# Patient Record
Sex: Female | Born: 1935 | Race: White | Hispanic: No | Smoking: Former smoker
Health system: Southern US, Community
[De-identification: ages and names within clinical notes are randomized; demographics above are authoritative.]

## PROBLEM LIST (undated history)

## (undated) DIAGNOSIS — E119 Type 2 diabetes mellitus without complications: Secondary | ICD-10-CM

## (undated) DIAGNOSIS — I252 Old myocardial infarction: Secondary | ICD-10-CM

## (undated) DIAGNOSIS — I639 Cerebral infarction, unspecified: Secondary | ICD-10-CM

## (undated) DIAGNOSIS — E785 Hyperlipidemia, unspecified: Secondary | ICD-10-CM

## (undated) DIAGNOSIS — G629 Polyneuropathy, unspecified: Secondary | ICD-10-CM

## (undated) DIAGNOSIS — I2510 Atherosclerotic heart disease of native coronary artery without angina pectoris: Secondary | ICD-10-CM

## (undated) DIAGNOSIS — J449 Chronic obstructive pulmonary disease, unspecified: Secondary | ICD-10-CM

## (undated) DIAGNOSIS — I10 Essential (primary) hypertension: Secondary | ICD-10-CM

## (undated) DIAGNOSIS — I739 Peripheral vascular disease, unspecified: Secondary | ICD-10-CM

## (undated) HISTORY — DX: Type 2 diabetes mellitus without complications: E11.9

## (undated) HISTORY — PX: OTHER SURGICAL HISTORY: SHX169

## (undated) HISTORY — DX: Peripheral vascular disease, unspecified: I73.9

## (undated) HISTORY — PX: TUBAL LIGATION: SHX77

## (undated) HISTORY — DX: Chronic obstructive pulmonary disease, unspecified: J44.9

## (undated) HISTORY — DX: Atherosclerotic heart disease of native coronary artery without angina pectoris: I25.10

## (undated) HISTORY — DX: Polyneuropathy, unspecified: G62.9

## (undated) HISTORY — DX: Old myocardial infarction: I25.2

## (undated) HISTORY — PX: APPENDECTOMY: SHX54

## (undated) HISTORY — DX: Cerebral infarction, unspecified: I63.9

## (undated) HISTORY — DX: Essential (primary) hypertension: I10

## (undated) HISTORY — DX: Hyperlipidemia, unspecified: E78.5

---

## 2003-09-19 ENCOUNTER — Inpatient Hospital Stay (HOSPITAL_COMMUNITY): Admission: EM | Admit: 2003-09-19 | Discharge: 2003-09-21 | Payer: Self-pay

## 2005-08-21 ENCOUNTER — Inpatient Hospital Stay (HOSPITAL_COMMUNITY): Admission: AD | Admit: 2005-08-21 | Discharge: 2005-08-28 | Payer: Self-pay | Admitting: Cardiology

## 2005-08-23 ENCOUNTER — Ambulatory Visit: Payer: Self-pay | Admitting: Cardiology

## 2005-08-23 ENCOUNTER — Encounter: Payer: Self-pay | Admitting: Internal Medicine

## 2005-09-14 ENCOUNTER — Ambulatory Visit: Payer: Self-pay | Admitting: Cardiology

## 2005-10-26 ENCOUNTER — Ambulatory Visit: Payer: Self-pay | Admitting: Cardiology

## 2005-10-26 ENCOUNTER — Inpatient Hospital Stay (HOSPITAL_COMMUNITY): Admission: EM | Admit: 2005-10-26 | Discharge: 2005-11-13 | Payer: Self-pay | Admitting: Cardiology

## 2005-10-27 ENCOUNTER — Ambulatory Visit: Payer: Self-pay | Admitting: Cardiology

## 2005-10-29 DIAGNOSIS — I252 Old myocardial infarction: Secondary | ICD-10-CM

## 2005-10-29 HISTORY — PX: CORONARY ARTERY BYPASS GRAFT: SHX141

## 2005-10-29 HISTORY — DX: Old myocardial infarction: I25.2

## 2005-11-08 IMAGING — CR DG CHEST 2V
2 series · 2 of 2 positions shown · non-contrast
Comparison: none

CLINICAL DATA: Status post CABG, chest pain, shortness of breath.
 CHEST - 2 VIEW:

[view not recorded (1 of 2)]
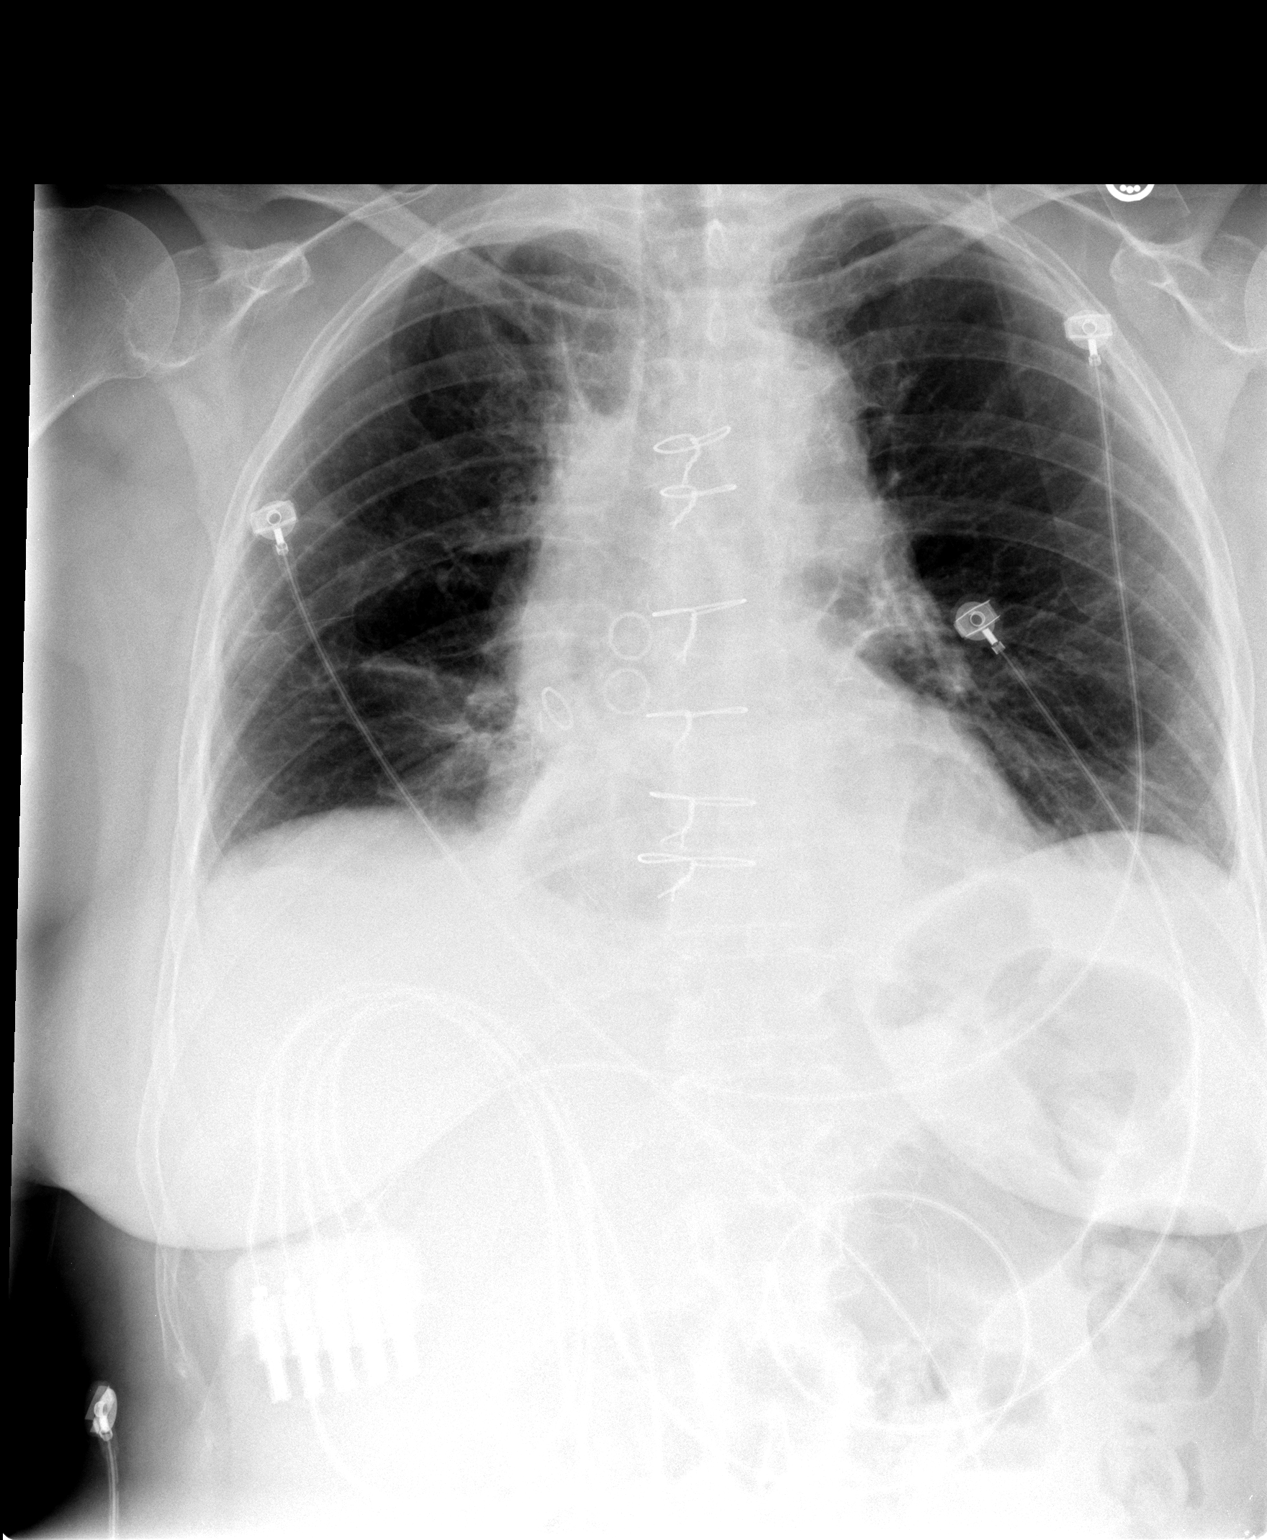

[view not recorded (2 of 2)]
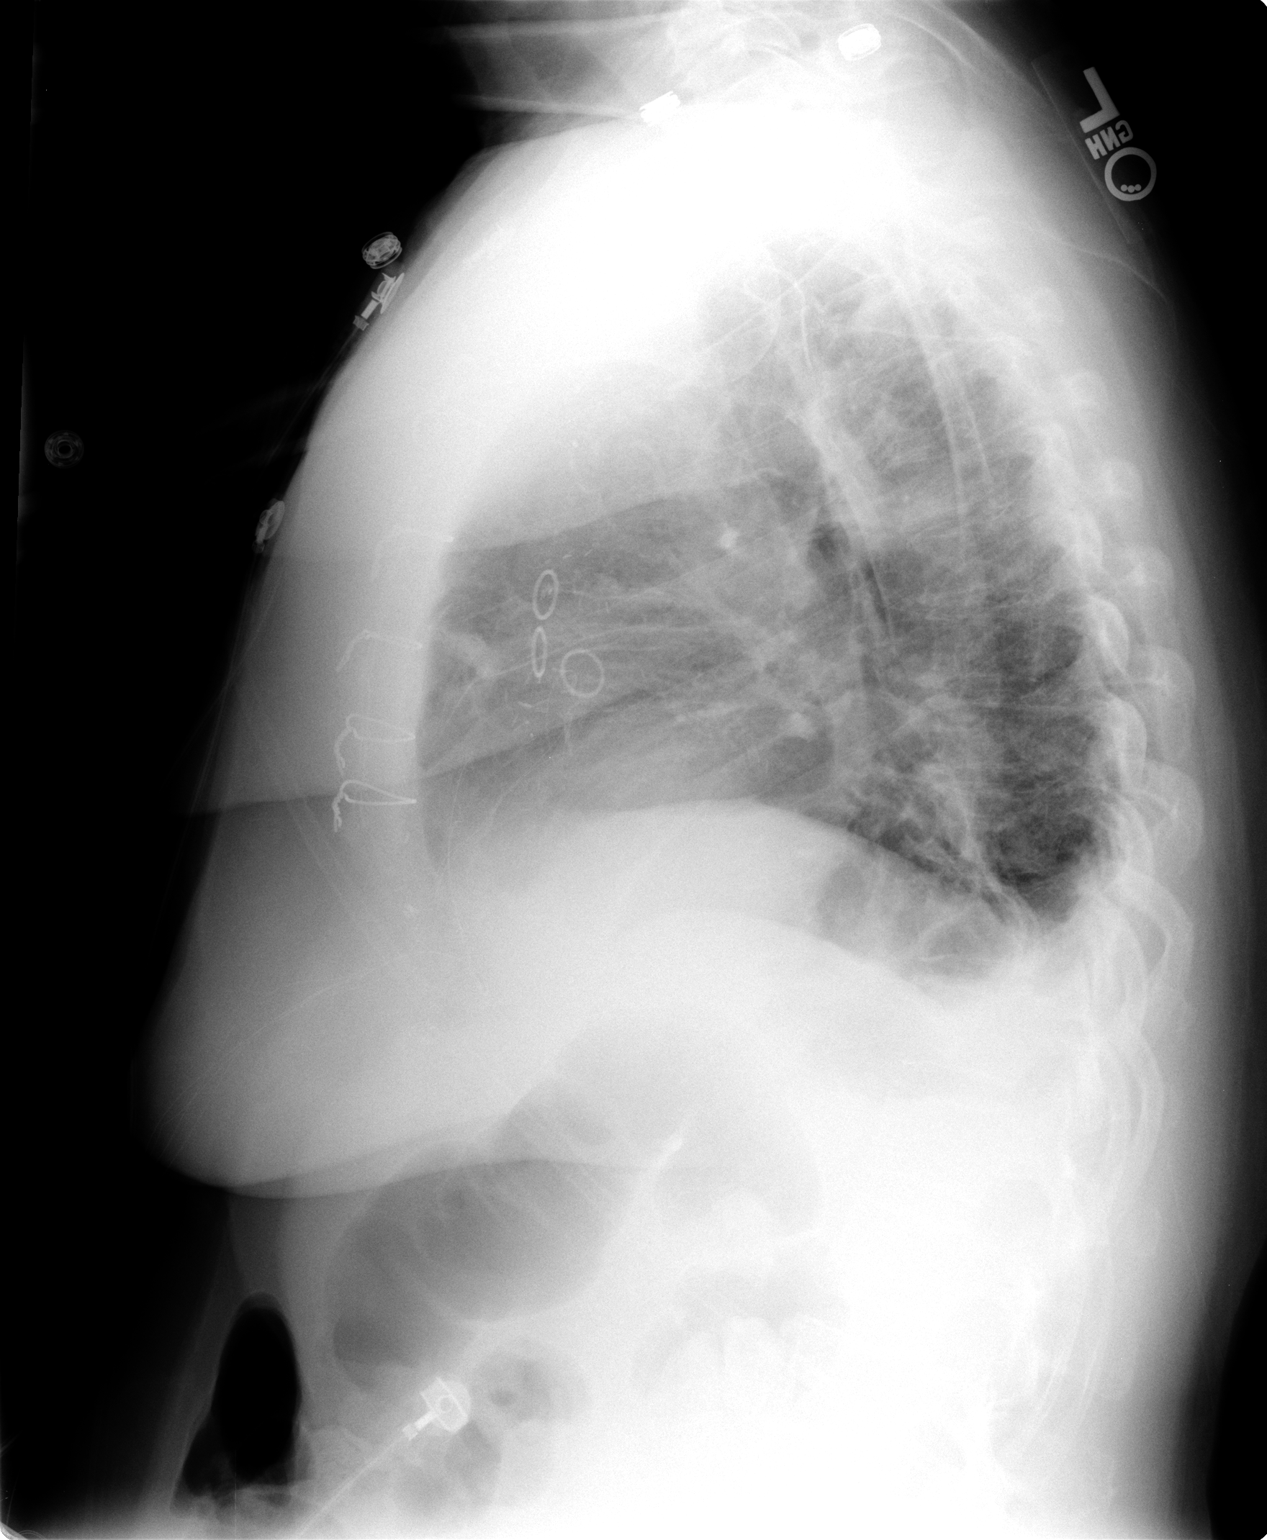

[2 of 2 positions shown; findings below may reference images not displayed]

FINDINGS: Small pleural effusions and mild atelectatic changes at the lung bases.  No pulmonary vascular congestion.  No pneumothorax.  Cardiomediastinal silhouette appears stable.
IMPRESSION: Subsegmental atelectasis at the bases, mainly on the right.  Small pleural effusions.  No pneumothorax.

## 2005-12-06 ENCOUNTER — Encounter
Admission: RE | Admit: 2005-12-06 | Discharge: 2005-12-06 | Payer: Self-pay | Admitting: Thoracic Surgery (Cardiothoracic Vascular Surgery)

## 2005-12-25 ENCOUNTER — Ambulatory Visit: Payer: Self-pay | Admitting: Cardiology

## 2006-03-20 ENCOUNTER — Ambulatory Visit: Payer: Self-pay | Admitting: Cardiology

## 2009-07-26 ENCOUNTER — Ambulatory Visit: Payer: Self-pay | Admitting: Cardiology

## 2010-06-05 ENCOUNTER — Encounter: Admission: RE | Admit: 2010-06-05 | Discharge: 2010-06-05 | Payer: Self-pay | Admitting: Neurosurgery

## 2010-11-07 ENCOUNTER — Ambulatory Visit: Admit: 2010-11-07 | Payer: Self-pay | Admitting: Vascular Surgery

## 2010-11-07 ENCOUNTER — Ambulatory Visit
Admission: RE | Admit: 2010-11-07 | Discharge: 2010-11-07 | Payer: Self-pay | Source: Home / Self Care | Attending: Vascular Surgery | Admitting: Vascular Surgery

## 2011-02-25 DIAGNOSIS — R079 Chest pain, unspecified: Secondary | ICD-10-CM

## 2011-02-26 DIAGNOSIS — I2510 Atherosclerotic heart disease of native coronary artery without angina pectoris: Secondary | ICD-10-CM

## 2011-03-13 NOTE — Consult Note (Signed)
NEW PATIENT CONSULTATION   Kristina Jones, Kristina Jones  DOB:  [birth year]                                       11/07/2010  CHART#:[id number]   The patient presents today for evaluation of lower extremity pain.  She  is a 76-year white female with multiple components to her discomfort.  She reports pain mainly in her hips and knees bilaterally that can occur  at rest or with walking.  She does not have any specific calf  claudication type symptoms.  She does have a history of diabetes.  She  is a poor historian.  She is here with her daughter.  She does have a  history of some degenerative disk disease and underwent injections in  her spine with little improvement.   PAST MEDICAL HISTORY:  Also significant for hypertension, elevated  cholesterol, prior heart attack, congestive heart failure and severe  COPD.  She is not compliant with her medication with any of these  issues..  She does have a prior history of prior elbow surgery and  history of left knee surgery.   SOCIAL HISTORY:  She is single with 5 children.  She is disabled.  She  does not smoke having quit in the past.  She does not drink alcohol on a  regular basis.   REVIEW OF SYSTEMS:  No weight loss or gain.  She weighs 195 pounds.  She  is 5 feet 2 inches tall.  VASCULAR:  Positive for pain in legs with walking and lying flat, does  have a history of mini stroke.  CARDIAC:  Positive shortness breath with lying flat  and with exertion.  GI:  Positive for reflux, difficulty swallowing, diarrhea, and  constipation.  NEUROLOGIC:  Positive for dizziness and headaches.  PULMONARY:  She does have home oxygen use, bronchitis, and wheezing.  HEMATOLOGIC:  Negative.  URINARY:  Positive for frequent urination.  ENT:  Positive for change in eyesight, change in hearing and sore  throat.  MUSCULOSKELETAL:  Positive for arthritis, joint pain, and muscle pain.  PSYCHIATRIC:  Positive for depression, anxiety and attention  deficit  disorder.   PHYSICAL EXAMINATION:  Well developed, well nourished obese white female  in no acute distress.  Blood pressure 132/80, pulse 72, respirations 24,  in no acute distress.  HEENT is normal.  Chest:  She does have some  wheezing bilaterally.  HEART:  Regular rate and rhythm without murmur.  I do not appreciate carotid bruits.  She has 2+ radial pulses, 2+  femoral pulses.  She does have diminished pedal pulses bilaterally.  Abdomen:  Soft, nontender, moderately obese.  Musculoskeletal:  Shows no  major deformity or cyanosis.  Neurologic:  No focal weakness,  paresthesias.  Skin:  Without ulcers.  Extremities:  Left foot is  notable for an ingrown toenail on the left which she has not sought  podiatry help for.  Her daughter states she is quite bad at picking at  these wounds and causing prolonged healing in her opinion.   She did undergo noninvasive laboratory studies in our office today and  this reveals normal ankle arm index on the right and slightly diminished  ankle arm index on the left at 0.79.  I feel that she should have  adequate flow to heal this ingrown toenail issue on her left foot.  I do  not  feel that the majority of her pain symptoms are related to arterial  insufficiency since she has resting symptoms and certainly does not have  level of arterial insufficiency that would suggest rest symptoms.  She  will continue to followup with her medical doctor and will see [place] again  on an as-needed basis. If she does have ongoing difficulty with healing,  I have recommended she see a podiatrist for treatment of this toe  lesion.     Larina Earthly, M.D.  Electronically Signed   TFE/MEDQ  D:  11/07/2010  T:  11/07/2010  Job:  [id number]   cc:   Payton Doughty, M.D.  Lia Hopping

## 2011-03-13 NOTE — Procedures (Signed)
LOWER EXTREMITY ARTERIAL DUPLEX   INDICATION:  Claudication per [facility] standing order.   HISTORY:  Diabetes:  Yes.  Cardiac:  CHF, MI.  Hypertension:  Yes.  Smoking:  Previous.  Previous Surgery:  No.  Other:  Complaint of mostly left lower extremity pain from the foot to  hip.  Patient denies classic intermittent claudication symptoms.   SINGLE LEVEL ARTERIAL EXAM                          RIGHT                LEFT  Brachial:               151                  148  Anterior tibial:        152                  19  Posterior tibial:       139                  120  Peroneal:  Ankle/Brachial Index:   1.01                 0.79   LOWER EXTREMITY ARTERIAL DUPLEX EXAM   DUPLEX:  1. Biphasic Doppler waveforms noted in the left common femoral artery      and proximal profunda femoral arteries.  2. Monophasic Doppler waveforms noted in the left superficial femoral      and popliteal arteries.  3. No significant increase in velocities noted throughout the left      lower extremity arterial system.   IMPRESSION:  1. Patent left lower extremity arterial system with no hemodynamically      significant increase in velocity.  2. Doppler waveforms and ankle brachial indices suggest normal      perfusion of the right lower extremity and mild/moderate decrease      of perfusion in the left lower extremity.   ___________________________________________  Larina Earthly, M.D.   CH/MEDQ  D:  11/07/2010  T:  11/07/2010  Job:  [id number]

## 2011-03-16 NOTE — Discharge Summary (Signed)
Kristina Jones, Kristina [name]:  [id number]   MEDICAL RECORD NO.:  [id number]          PATIENT TYPE:  INP   LOCATION:  2036                         FACILITY:  [facility]   PHYSICIAN:  Salvatore Decent. Hendrickson, M.D.DATE OF BIRTH:  [birth year]   DATE OF ADMISSION:  10/26/2005  DATE OF DISCHARGE:  11/13/2005                                 DISCHARGE SUMMARY   ADMISSION DIAGNOSIS:  Unstable angina over the last one to two weeks, status  post non-ST elevation myocardial infarction with angioplasty and bare metal  stents to the left anterior descending and circumflex.   DISCHARGE DIAGNOSES:  1.  Coronary artery disease, status post coronary artery bypass graft times      four.  2.  Hyperlipidemia.  3.  Hypertension.  4.  History of myocardial infarction.  5.  Diabetes mellitus, type 2, on insulin.  6.  Tobacco abuse.  7.  Chronic obstructive pulmonary disease.  8.  Chronic dyspnea on exertion.  9.  Obstructive sleep apnea.  10. History of noncompliance due to financial issues.   PROCEDURE:  Median sternotomy, extracorporeal circulation, coronary artery  bypass graft times four with LIMA to the LAD, saphenous vein graft to the  first diagonal, saphenous vein graft to the first obtuse marginal, saphenous  vein graft to posterior descending artery, and endoscopic vein harvest to  right leg. Procedures done by Dr. Charlett Lango on November 05, 2005.   HISTORY AND PHYSICAL EXAM:  Ms. Blando is a 77 year old female who has a  history of coronary artery disease, diabetes, hypertension, hyperlipidemia,  tobacco abuse, and COPD, who was seen and treated at [facility] in  October 2006 with non-ST-elevation MI. She has three vessel disease with  chronically totaled right coronary artery, she had a significant proximal  LAD and circumflex disease. The patient was treated with angioplasty and  Plavix. This was due to her history of noncompliance. The patient presented  on  October 26, 2005, with a two week history of neck and jaw discomfort.  The patient had been taking sublingual nitroglycerin for his jaw discomfort.  She complains of some shortness of breath as well as a couple of episodes of  diaphoresis in association with these episodes. The patient has had episodes  at rest. The patient then underwent cardiac catheterization which revealed  again severe three-vessel coronary artery disease with in-stent peri-stent  disease as well as distal left main disease. The cardiac catheterization  showed an ejection fraction of 60%, the left distal main was approximately  60% to 70% stenosed. She did have an episode of pain post catheterization  which resolved with intravenous nitroglycerin.   CVTS was consulted on October 30, 2005. The patient has been on Plavix prior  to admission and was advised to undergo coronary artery bypass grafting. We  waited for the patient to be off for Plavix for six days to allow the  platelet population to repopulate and then we would proceed with a CABG. The  patient did understand the risks of the surgery and agreed to proceed.   HOSPITAL COURSE:  From time of consult on October 30, 2005, to November 05, 2005,  the patient's hospital stay remained uneventful. The patient remained  stable, afebrile, and without chest pain. The patient was started on a  nitroglycerin drip. The patient did have an episode of hyperglycemia  throughout her stay. Her hemoglobin A1c was 13.7.  We monitored on a sliding  scale insulin.  Labs otherwise remained within normal limits. After the  coronary artery bypass graft procedure the patient was extubated  uneventfully on November 05, 2005, with oxygen saturations at 98%. On  postoperative day #1, the patient remained afebrile. She is neurologically  intact. The patient did have some postoperative anemia secondary to blood  loss which was monitored throughout her stay. The patient received one unit  of  packed red blood cells on November 10, 2005. She was also given Niferex  since November 09, 2005.   The patient did have some acute renal failure. She was on lisinopril and  Lasix. The a.m. Lasix was held on November 09, 2005. The patient is having  some leukocytosis. Her white blood cell count has increased to 15.6. There  are no signs of infection and the patient remained afebrile. A urine culture  was done and was positive for UTI. The patient was given Cipro for five  days. The patient did have an increase in her CBGs. She was monitored on a  sliding scale insulin scale. Her Lantus was increased to 21 units b.i.d.  Endocrinology was consulted. The patient was ambulating daily with some  assistance in using her pulmonary toilet.  The patient also has some  external drainage in the mid sternum. Drainage is serous.  She does have  positive erythema in the surrounding incision. The patient is on Cipro.   PHYSICAL EXAMINATION:  On discharge vital signs are stable with temperature  of 97.7, pulse 99, blood pressure 109/68, O2 saturation 93% on room air,  weight 176.  Cardiac revealed a regular rate and rhythm. Respirations  revealed decreased breath sounds bilaterally at the lobes. She does have  some scattered crackles. Abdomen benign. Extremities revealed positive edema  for which she is on Lasix for. Her right leg incisions are clear, dry, and  intact with some mild bruising.  The patient's sternal incision does have  some serous drainage in the mid sternum and erythema surrounding the  incision.   The patient is going to be discharged home in good condition.   MEDICATIONS:  1.  Lopressor 25 mg b.i.d.  2.  Aspirin 325 mg daily.  3.  Zocor 80 mg q.h.s.  4.  Niferex 150 mg b.i.d.  5.  Lopid 600 mg b.i.d.  6.  Amaryl 4 mg daily.  7.  Lantus 25 units b.i.d.  8.  Combivent inhaler as previously used at home. 9.  Oxycodone 5 mg one to two tablets q.4h. p.r.n.   The patient was  instructed to have no driving or lifting of greater than 10  pounds for three weeks. The patient was instructed to ambulate three to four  times daily and progress as tolerated. She is to continue her breathing  exercises. The patient is to follow a low fat, low salt diabetic diet. The  patient was instructed to shower and cleanse her wounds with her mild soap  and water. She is to call the office if any further wound problems arise.  The patient has an appointment with Dr. Dorris Fetch in three weeks. She is  instructed to call  her cardiologist for an appointment in two weeks or she  will have a chest x-ray taken.     Constance Holster, PA    ______________________________  Salvatore Decent Dorris Fetch, M.D.   JMW/MEDQ  D:  11/12/2005  T:  11/12/2005  Job:  [id number]

## 2011-03-16 NOTE — Cardiovascular Report (Signed)
NAME:  Kristina Jones, Kristina [name]:  [id number]   MEDICAL RECORD NO.:  [id number]          PATIENT TYPE:  INP   LOCATION:  2015                         FACILITY:  [facility]   PHYSICIAN:  Charlies Constable, M.D. [facility] DATE OF BIRTH:  [birth year]   DATE OF PROCEDURE:  10/30/2005  DATE OF DISCHARGE:                              CARDIAC CATHETERIZATION   PAST CLINICAL HISTORY:  Kristina Jones is 77 years old and has known coronary  disease and had a previous stenting of the circumflex marginal vessel and on  08/22/05, after suffering an non ST elevation infarction, underwent stenting  of the proximal LAD and stenting of the ostium of the circumflex artery  using bare metal stents.  She developed a current neck pain and was seen up  in [place] and was admitted to the hospital with a diagnosis of unstable  angina.   PROCEDURES:  The procedure was performed via the right femoral artery using  a 6-French preformed coronary catheters.  A front wall arterial puncture was  prominent and opaque contrast was used.  Subclavian injection was performed  to assess the intramammary artery for suitability of bypass grafting.  The  patient tolerated the procedure well and left the laboratory in satisfactory  condition.   RESULTS:  The left main coronary had 70% distal stenosis.   The left anterior descending artery had a 90% ostial stenosis that extended  to the proximal edge of the stent in the proximal LAD.  There was diffuse  40% narrowing within the stent.  A first diagonal branch which appeared to  arise just proximal to the proximal edge of the stent, had a 90% ostial  stenosis.  Distal LAD was free of major obstruction and gave rise to  __________ and a second diagonal branch.   The circumflex artery __________ gave rise to a large marginal branch and a  posterolateral branch.  There was 90%  ostial stenosis within the stent and  80% diffuse stenosis within the stent.  The stent in the marginal  branch had  0% stenosis.   The right coronary artery was completely occluded proximally.  Distal right  coronary artery consistent of posterior descending and small posterolateral  branch and filled by collaterals from the LAD.   The left ventricular and __________ showed hypo to akinesis of inferobasilar  wall.  The overall wall motion was filled with an estimated ejection  fraction of 60%.   Aortic pressure was 122/66 and a mean of 88 and a left ventricular pressure  of 122/9.   CONCLUSION:  Coronary artery status post previous percutaneous coronary  interventions as described above with 70% stenosis in the distal left main,  90% ostial stenosis in the LAD with 40% narrowing within the stent in the  proximal LAD and 90% ostial stenosis of the first large diagonal branch, 90%  ostial and 80% proximal and stent re-stenosis in the circumflex artery with  0% stenosis at the stent site in the marginal branch and total occlusion of  the right coronary artery with inferobasilar wall hypo-akinesis in an  estimated ejection  fraction of 60%.   RECOMMENDATIONS:  The patient developed restenosis in both stents and also  has developed progressive left main disease.  I think there is no other good  option other than bypass surgery.  Will obtain surgical consultation.  I  have discussed with the patient and she is agreeable with obtaining surgical  consultation.           ______________________________  Charlies Constable, M.D. [facility]     BB/MEDQ  D:  10/30/2005  T:  10/30/2005  Job:  [id number]   cc:   Eduardo Osier. Sharyn Lull, M.D.  Fax: [phone]   Jonelle Sidle, M.D. [facility]  [address]  [place]  [place] [address]   Charlies Constable, M.D. [facility]  [address]  [address]  [place]  [place] [address]

## 2011-03-16 NOTE — Cardiovascular Report (Signed)
Kristina Jones, GULAS NO.:  [id number]   MEDICAL RECORD NO.:  [id number]                   PATIENT TYPE:  INP   LOCATION:  4703                                 FACILITY:  [facility]   PHYSICIAN:  Rollene Rotunda, M.D.                DATE OF BIRTH:  21-Jul-1934   DATE OF PROCEDURE:  09/20/2003  DATE OF DISCHARGE:                              CARDIAC CATHETERIZATION   PRIMARY:  [name], MD   PROCEDURE:  Left heart catheter, coronary arteriography.   INDICATIONS:  Evaluate patient with unstable angina.   PROCEDURAL NOTE:  Left heart catheterization was performed via the right  femoral artery.  The artery was cannulated using anterior wall puncture.  A  #6-French arterial sheath was inserted via the modified Seldinger technique.  Preformed Judkins and a pigtail catheter were utilized.  The patient  tolerated the procedure well and left the laboratory in stable condition.   RESULTS:  HEMODYNAMICS:  LV 149/14.  AO 142/93.   CORONARIES:  The left main had luminal irregularities.   The LAD had a proximal 25% stenosis followed by 30% stenosis.  There was mid  diffuse 25% stenosis.  A small first diagonal had diffuse 30-40% stenosis.  A small second diagonal had diffuse 30% stenosis.   The circumflex had long proximal 25% stenosis.  There was 25% stenosis in  the AV groove at the mid obtuse marginal.  The AV groove had mid to distal  diffuse 25-30% stenosis.  There was a large mid obtuse marginal with  proximal 75% stenosis.   The right coronary artery was occluded proximally.  PDA filled via septal  collaterals.   LEFT VENTRICULOGRAM:  The left ventriculogram was obtained in the RAO  projection.  The EF was approximately 60% with mild inferior hypokinesis.   AORTOGRAM:  A distal aortogram was obtained.  The renals were normal.  The  mid and distal aorta had diffuse luminal irregularities.  The right iliac  had moderate nonobstructive disease.  The  left iliac had diffuse luminal  irregularities.   CONCLUSION:  Diffuse moderate obstructive coronary disease in the left  anterior descending and circumflex system.  There does appear to be a high  grade lesion in a moderate to large sized marginal.  She does have an  occluded right coronary.   PLAN:  I will review the films with Carole Binning, M.D. [facility] for possible  angioplasty of the obtuse marginal.  The patient needs very aggressive  secondary risk reduction as her most important therapy.                                               Rollene Rotunda, M.D.    JH/MEDQ  D:  09/20/2003  T:  09/20/2003  Job:  [id number]

## 2011-03-16 NOTE — Discharge Summary (Signed)
Kristina Jones, Kristina [name]:  [id number]   MEDICAL RECORD NO.:  [id number]          PATIENT TYPE:  INP   LOCATION:  4714                         FACILITY:  [facility]   PHYSICIAN:  Salvadore Farber, M.D. LHCDATE OF BIRTH:  1934-07-17   DATE OF ADMISSION:  08/21/2005  DATE OF DISCHARGE:  08/28/2005                                 DISCHARGE SUMMARY   PRIMARY CARDIOLOGIST:  Jonelle Sidle, M.D.   PRIMARY CARE PHYSICIAN:  Dr. Carolynn Comment   PRINCIPAL DIAGNOSES:  1.  Non-ST segment elevation myocardial infarction with percutaneous      transluminal coronary angioplasty and bare metal stents to the left      anterior descending and circumflex.  2.  Residual coronary artery disease in the right coronary artery which is      totaled with left-to-right collateral circulation.  3.  Preserved left ventricular function with an ejection fraction of 70% and      no wall motion abnormality.  4.  Chronic pain issues.  5.  Hyperlipidemia.  6.  Hypertension.  7.  Diabetes.  8.  Chronic dyspnea on exertion with oxygen saturation 94% after ambulation      on room air at discharge.  9.  History of tobacco use.  10. Family history of coronary artery disease.  11. Intolerance to morphine and over-sedation with Dilaudid and Stadol.  12. Enrolled in the [place] trial and research to followup.   PROCEDURES:  1.  Cardiac catheterization.  2.  Coronary arteriogram.  3.  Left ventriculogram.  4.  Bair metal stent to the proximal LAD.  5.  Bair metal stent to the ostium of the circumflex.  6.  Intravascular ultrasound of the circumflex.   HOSPITAL COURSE:  Kristina Jones is a 77 year old female with known coronary  artery disease.  She had a catheterization in 2004, which showed RCA which  was totally occluded, and a critical first marginal lesion which was  stented.  She had some other moderate disease which was treated medically.  She had quit seeing physicians and quit taking all  of her medications due to  expense, but continued to smoke cigarettes.  She developed chest pain on the  day of admission and came to the [facility] Emergency Room where her symptoms  improved with sublingual nitroglycerin.  Her EKG was mildly abnormal and her  initial troponin was borderline.  She was transferred to [facility]  for further evaluation and treatment.   Her enzymes were elevated, indicating a non-ST segment elevation MI.  Her CK-  MB was 12.4, with a troponin-I of 0.42.  Cardiac catheterization showed  three vessel disease with an 80% LAD and a 90% circumflex which were both  treated with Bair metal stents.  The RCA was unchanged and had collateral  circulations.  Her EF was normal.   Kristina Jones has problems with chronic hip pain and also had some arm pain  after a pneumonia shot.  She was treated with Dilaudid and Stadol, but both  caused over sedation requiring Narcan and these medications were  discontinued.   A lipid profile showed a total cholesterol of 212, triglycerides 494, HDL  26, and the LDL was not calculated.  Lipitor 40 mg was added to her  medication regimen, but was changed to Zocor at discharge for cost savings.  She also had Lopid added for the elevated triglycerides.   Kristina Jones had some problems with wheezing and was given nebulizers on a  p.r.n. basis.  She was started on Combivent meter dosed inhaler and is to  continue this at discharge.   Kristina Jones has a history of diabetes, but had not been following a diabetic  diet and had not been taking her diabetes medication.  She was started on  Amaryl and had Lantus added to her medication regimen as well.  She is to  follow up with Dr. Linna Darner on this.  She also had nicotine patches started to  help her discontinue tobacco smoking.   Kristina Jones has significant problems with anxiety.  She required medication  during her hospital stay, but we are not able to provide her with anything  at  discharge.  I requested her followup with Dr. Linna Darner I feel that this is  an ongoing problem and she would benefit from therapy.   Kristina Jones was hypoxic at times with O2 saturation decreasing to 79% at  times after ambulation.  She was continued on the Combivent and she  gradually improved.  By discharge, she was ambulating and her oxygen  saturation was staying in the mid 90s.   Her medications were adjusted for hypertensive control, including a beta  blocker and an ACE inhibitor.  Additionally, she had some short runs of non-  sustained VT, and so the beta blocker was up-titrated to  25 mg of Lopressor q.8h.  At the time, her systolic blood pressure was in  the 110s to 120s, but her heart rate was still in the 70s.  It was not felt  we could increase further because of her blood pressure being borderline,  but perhaps this can be done as an outpatient.   Kristina Jones was seen by cardiac rehab, smoking cessation, and nutrition.  She  was given instruction on a diabetic diet and smoking cessation.   By August 28, 2005, Kristina Jones was ambulating without chest pain or  shortness of breath.  She was evaluated by Dr. Samule Ohm and considered stable  for discharge with outpatient followup arranged.   DISCHARGE INSTRUCTIONS:  1.  Her activity level is to include no driving for four days and no lifting      for two weeks.  2.  She is to stick to a low fat, diabetic diet.  3.  She is to call for problems with the cath site.   DISCHARGE MEDICATIONS:  1.  Coated aspirin 325 mg daily.  2.  Plavix 75 mg daily.  3.  Zocor 80 mg 1/2 tab daily.  4.  Nicotine patch 14 mg daily.  5.  Amaryl 4 mg daily.  6.  Lopid 600 mg b.i.d.  7.  Lisinopril 5 mg daily.  8.  Combivent 2 puffs b.i.d.  9.  Lantus 10 units daily.  10. Nitroglycerin sublingual p.r.n.  11. Metoprolol 25 mg t.i.d.   FOLLOWUP: 1.  She is to follow up with Dr. Diona Browner on September 14, 2005, at 1 p.m.  2.  Dr. Linna Darner as well.       Theodore Demark, P.A. [facility]      Salvadore Farber, M.D.  [facility]  Electronically Signed    RB/MEDQ  D:  08/28/2005  T:  08/28/2005  Job:  [id number]

## 2011-03-16 NOTE — Cardiovascular Report (Signed)
NAMEDINNA, SEVERS NO.:  [id number]   MEDICAL RECORD NO.:  [id number]          PATIENT TYPE:  INP   LOCATION:  2909                         FACILITY:  [facility]   PHYSICIAN:  Salvadore Farber, M.D. LHCDATE OF BIRTH:  1934-03-09   DATE OF PROCEDURE:  08/22/2005  DATE OF DISCHARGE:                              CARDIAC CATHETERIZATION   PROCEDURES PERFORMED:  1.  Left heart catheterization.  2.  Left ventriculography.  3.  Coronary angiography.  4.  Bare metal stenting of the proximal left anterior descending.  5.  Bare metal stenting to the ostium of the circumflex.  6.  Intravascular ultrasound of the circumflex.   CARDIOLOGIST:  Salvadore Farber, M.D.   INDICATIONS:  Ms. Pharr is a 77 year old woman status post prior stenting  of the first obtuse marginal who presents now with non-ST elevation  myocardial infarction.  She has substantial baseline pulmonary disease and  chronic severe left hip pain.  She was referred for diagnostic angiography  and possible percutaneous revascularization.   PROCEDURAL TECHNIQUE:  Informed consent was obtained.  Under 1% lidocaine  local anesthesia a 6 French sheath was placed in the right common femoral  artery using the modified Seldinger technique.  Diagnostic angiography and  ventriculography were performed using JL-4, JR-4 and pigtail catheters.  These images demonstrated an 80% stenosis in the proximal LAD.  The  electrocardiogram suggested this was the culprit lesion.  There was also a  suggestion of a shelf-like lesion in the very proximal circumflex.  This was  difficult to see angiographically despite taking multiple views.  Decision  was therefore made to proceed with stenting of the  LAD and intravascular  ultrasound interrogation of the circumflex to guide further therapy of it.   The patient moaned throughout the procedure and despite substantial doses of  Dilaudid of  pain control of her left hip.   Anticoagulation was initiated  with heparin and double bolus eptifibatide.  Alla Feeling study drug was  administered.  ACT was confirmed to be greater than 200 seconds and  maintained such throughout the case.   A 6 [place] CLS-3.5 guide was advanced over a wire and engaged in the ostium  of the left main.  ProWater wire was advanced to the distal LAD without  difficulty.  The lesion of the LAD was predilated using a 2.5 x 15 mm  Maverick at eight atmospheres.  The lesion was then stented using a 2.75 x  23 mm Vision stent deployed at 16 atmospheres.  The stent was then  postdilated using a 3.0 x 18 mm PowerSail at 18 atmospheres.   Attention was then turned to the circumflex.  The ProWater wire was removed  from the LAD and advanced into the distal portion of the first obtuse  marginal.  I then administered 200 mcg of intracoronary nitroglycerin.  I  advanced an IVUS catheter into the mid circumflex without difficulty.  Automatic pullback was performed.  This demonstrated diffuse and severe  disease of the proximal circumflex with occlusion around the 1 mm catheter  approximately 3 mm  in from the ostium.  Decision was therefore made to  proceed to percutaneous revascularization of this.   The lesion was predilated using the 2.5 x 15 mm Maverick at 10 atmospheres.  I then placed a 2.5 x 18 mm  Mini Vision beginning from the ostium of the  circumflex.  I deployed it at 16 atmospheres.  I then postdilated it using a  2.5 mm Quantum for two successive inflations at 18 atmospheres distally and  20 atmospheres at the ostium.  Final angiography demonstrated no residual  stenosis and TIMI III flow to the distal vasculature in both the LAD and the  circumflex.   The arteriotomy was then closed using a Starclose device.  Complete  hemostasis was obtained.   The patient was then transferred to the holding room in stable condition.   COMPLICATIONS:  None.   FINDINGS:   HEMODYNAMIC DATA:  LV  167/8/14.  EF 70% without regional wall motion  abnormalities.  No aortic stenosis or mitral regurgitation.   Left Main:  The left main has diffuse atherosclerosis without significant  stenosis.   LAD:  The LAD is a large vessel giving rise to two moderate-sized diagonals.  The proximal LAD had an 80% heavily calcified stenosis.  This was stented  to no residual.   Ramus Intermedius:  The ramus intermedius is a relatively small vessel  without significant disease.   Circumflex:  The circumflex is a moderate size vessel giving rise to two  obtuse marginals.  The ostial circumflex had a heavily calcified 90%  stenosis that was stented to no residual.  There was a 60% stenosis proximal  to the stent in the first obtuse marginal.  The stent itself was widely  patent.  There is a 40% stenosis in the AV groove circumflex before the  second marginal.   RCA:  The RCA vessel is occluded proximally.  The distal vessel is supplied  via left-to-right collaterals. This is unchanged from several years ago.   AORTOGRAPHIC DATA:  Abdominal Aortogram:  The abdominal aortogram showed  minimal disease of the abdominal aorta without evidence of aneurysmal  formation.  There is minimal atherosclerotic disease at the infrarenal  abdominal aorta without evidence of aneurysm.  There are single renal  arteries bilaterally and both are normal.   IMPRESSION AND RECOMMENDATIONS:  Successful bare metal stenting of both the  left anterior descending and circumflex.   Bare metal stents were chosen due to her history of medical noncompliance.  She should be continued on a combination of aspirin and Plavix for 30 days.  Consideration should be given to longer term Plavix if compliance can be  achieved.     Salvadore Farber, M.D. [facility]  Electronically Signed    WED/MEDQ  D:  08/22/2005  T:  08/23/2005  Job:  [id number]

## 2011-03-16 NOTE — Discharge Summary (Signed)
Kristina, [name]:  [id number]   MEDICAL RECORD NO.:  [id number]                   PATIENT TYPE:  INP   LOCATION:  6523                                 FACILITY:  [facility]   PHYSICIAN:  Amberg Bing, M.D.               DATE OF BIRTH:  [birth year]   DATE OF ADMISSION:  09/19/2003  DATE OF DISCHARGE:  09/21/2003                                 DISCHARGE SUMMARY   DISCHARGE DIAGNOSES:  1. Coronary artery disease.     A. Status post drug-eluding stent to the obtuse marginal #1, reducing        stenosis from 75 to 0%.     B. Totally occluded right coronary artery, mild residual nonobstructive        disease.  2. Preserved left ventricular function with ejection fraction of 60%.  3. Dyspnea on exertion.  4. Hypertension.  5. Adult-onset diabetes mellitus.  6. Dyslipidemia.  7. Degenerative joint disease.  8. Tobacco abuse.  9. Family history of coronary artery disease.  10.      Urinary tract infection.   PROCEDURE PERFORMED:  1. Cardiac catheterization by Dr. Rollene Rotunda September 20, 2003:  Left     main Luminal irregularities; LAD to proximal 25% to 30%, mid diffuse 25%,     D1 small diffuse 30 to 40%, D2 small diffuse 30%; circumflex long     proximal 25%, 25% at mid obtuse marginal, distal diffuse 25 to 30%. Mid     obtuse marginal proximal 75% stenosis; RCA 100% proximal; PDA fills via     septal collaterals. Distal aortogram with diffuse nonobstructive disease,     patent renals. Left ventriculogram with an EF of 60%.  2. Percutaneous coronary intervention by Dr. Loraine Leriche Pulsipher with drug-     eluding stent placement to the obtuse marginal #1 with reduction of     stenosis of 75 to 0%. Procedure was performed via the left femoral     artery. Wire could not be advanced beyond the sheath in the right femoral     artery.   HOSPITAL COURSE:  Please see admission history and physical dictated by Dr.  Moishe Spice for complete details.  Briefly, this 77 year old female was  transferred [facility] with complaints of chest pressure and  shortness of breath. Her cardiac enzymes were negative. It was felt that  with her risk factors and symptoms she should be further evaluated with  cardiac catheterization. This was set up for September 20, 2003. She  underwent catheterization as noted above by Dr. Antoine Poche with the above  noted findings. She subsequently underwent drug-eluding stent placement to  the obtuse marginal #1 by Dr. Gerri Spore on the same day. She tolerated both  procedures well. She did have some pain in bilateral groins but had no  hematomas post procedure. On the morning of September 21, 2003, she was in  stable condition. She had no further chest pain. She still complained of  dyspnea on exertion. Post procedure CK-MB was negative. Urinalysis did show  positive nitrates, leukocytes, and bacteria. The patient's lipid panel also  returned revealing a total cholesterol of 210, triglycerides 257, HDL 40,  LDL 119. She was placed on Zocor at discharge. Her lisinopril was adjusted  to 20 mg daily for her hypertension. Lopressor 25 mg twice a day was also  added to her blood pressure medicine regimen. She would need Plavix for six  to nine months. She will need a BMET in approximately two weeks to follow up  on her renal function, given the increase in her lisinopril. She will also  need LFTs and lipids checked in about six to eight weeks given the  initiation of Zocor. She prefers to followup in [place], and we will set her up  with Dr. Diona Browner. We may need to consider outpatient pulmonary consultation  as well as PFTs given her continued dyspnea on exertion.   LABORATORY DATA:  Room air ABGs:  PH 7.445, pCO2 34.9, pO2 73.7, bicarbonate  24.8, oxygen saturation 95.1. White count 7,800, hemoglobin 14.5, hematocrit  43.1, platelet count 225,000. D-dimer less than 0.22. Sodium 139, potassium  4, chloride 107, CO2  26, glucose 111, BUN 14, creatinine 0.9; total  bilirubin 0.5, alkaline phosphatase 48, AST 17, ALT 17, total protein 5.8,  albumin 3.3, calcium 8.5. Hemoglobin A1c 6.7. Cardiac enzymes as noted  above. Lipid panel as noted above. Urinalysis as noted above. [facility]  from [facility]:  Cardiomegaly and no acute disease.   DISCHARGE MEDICATIONS:  1. Plavix 75 mg daily for six to nine months.  2. Coated aspirin 325 mg daily.  3. Lopressor 25 mg twice daily.  4. Lisinopril 20 mg daily.  5. Glucotrol 10 mg daily-as taken before.  6. Zocor 20 mg q.h.s.  7. Nitroglycerin p.r.n. chest pain.  8. Cipro 250 mg b.i.d. for three days.   PAIN MANAGEMENT:  Tylenol as needed. Nitroglycerin as needed for chest pain.  She is to call our office or 911 for recurrent chest pain.   ACTIVITY:  No driving, heavy lifting, exertion, or work for one week.   DIET:  Low fat, low sodium, diabetic diet.   WOUND CARE:  The patient is to call office for any groin swelling, bleeding,  or bruising.   FOLLOW UP:  The patient is to followup with our team in [place], [place]. She will be set up to see Dr. Diona Browner in the next two weeks. She  is to see Dr. Clelia Croft as needed. At her followup appointment, we will need to  set her up with fasting lipids and LFTs within the next six to eight weeks.  She will also need a BMET at her followup appointment in two weeks in [place].  A urine culture and sensitivity will be sent off prior to discharge. As  noted above, consideration may be given towards outpatient pulmonary  consultation as well as PFTs given her continued dyspnea.      Kristina Jones, P.A.                        [name], M.D.    SW/MEDQ  D:  09/21/2003  T:  09/21/2003  Job:  [id number]   cc:   [facility]  [address], [address]  [place], [place] [address]   Sherryll Burger, M.D.  [employer]

## 2011-03-16 NOTE — H&P (Signed)
Kristina Jones, [name]:  [id number]   MEDICAL RECORD NO.:  [id number]          PATIENT TYPE:  INP   LOCATION:  2909                         FACILITY:  [facility]   PHYSICIAN:  [place] Bing, M.D. [facility] OF BIRTH:  23-Oct-1934   DATE OF ADMISSION:  08/21/2005  DATE OF DISCHARGE:                                HISTORY & PHYSICAL   REFERRING:  Emergency Department at [facility].   PRIMARY CARE PHYSICIAN:  Dr. Linna Darner.   PRIMARY CARDIOLOGIST:  Dr. Diona Browner.   HISTORY OF PRESENT ILLNESS:  A 77 year old woman with known coronary disease  presents with recurrent chest discomfort. Kristina Jones first was seen at [facility] in 2004 when she presented with unstable angina. At  catheterization, she was found to have an old total occlusion of the right  coronary artery, moderate LAD and circumflex disease and a critical first  marginal lesion, which was stented. The patient subsequently did well with  no symptoms other than class II exertional dyspnea. She was initially  followed in our [place] office, but subsequently stopped seeing physicians and  stopped all of her medications due to expense. She has continued to smoke  cigarettes. On the evening of admission, she developed the sudden onset of  sharp left chest discomfort as well as bilateral jaw discomfort accompanied  by dyspnea. She was felt to be clammy, but was not subjectively diaphoretic.  There was no nausea. She summoned EMS who administered sublingual  nitroglycerin spray with improvement. She received more sublingual  nitroglycerin in the emergency department with resolution of her symptoms.  Initial troponin is borderline by the local assay. EKG is abnormal with  prior inferior myocardial infarction, poor R-wave progression and diffuse  mild ST-segment depression, but we have no previous tracing for comparison.  On route, 2 milligrams of morphine was administered. The patient was  described to become lethargic and have increased problems with respiration.  There is a question of stridor. Increased oxygen was administered with  resolution.   Kristina Jones has multiple contributors to vascular disease including  hypertension, hyperlipidemia and diabetes. She has not required treatment  with insulin in the past. She has had complications of retinopathy and  neuropathy. She has a 40-60 pack year history of cigarette smoking.   Past medical history is otherwise notable for obesity, a prior right arm  fracture, prior left knee surgery, appendectomy and bilateral tubal  ligation.   The patient has no prior known drug allergies, but there is a question of  adverse effect of MORPHINE.   Medications at hospital discharge in 2004 included Glucotrol 10 milligrams  daily, simvastatin 20 milligrams q.d., Lisinopril 20 milligrams q.d.,  metoprolol 25 milligrams b.i.d., aspirin 325 milligrams q.d. and a course of  clopidogrel.   SOCIAL HISTORY:  Lives alone in [place] in a mobile home; works as a Financial risk analyst;  multiple family members live locally. Denies excessive use of alcohol.  Sedentary lifestyle.   FAMILY HISTORY:  Mother died due to renal disease; father suffered a fatal  CVA; brother has had an MI and bypass  surgery as has a sister. Three other  sisters have died from, one from cardiac disease, one from diabetes and one  from cancer.   Review of systems is notable for arthritic discomfort in her knees and hips;  she experiences occasional headaches; she notes decreased hearing acuity.  She has occasional constipation. All other systems reviewed and are  negative.   PHYSICAL EXAMINATION:  GENERAL:  On exam, pleasant woman with flushed skin,  in no acute distress.  VITAL SIGNS:  The heart rate is 70 and regular, O2 saturation is 96%. Blood  pressure 140/90, respirations 16, temperature 99.  HEENT: Anicteric sclerae; normal conjunctiva; lesions over the right eyelid  consistent  with cholesterol deposition.  NECK: No jugular venous distension; normal carotid upstrokes without bruits.  ENDOCRINE: No thyromegaly.  HEMATOPOIETIC: No adenopathy.  SKIN: Dry; no significant lesions.  LUNGS: Minimal basilar rales and rhonchi.  CARDIAC: Normal first and second heart sounds; no murmurs, rubs or gallops;  normal PMI.  ABDOMEN: Soft, nontender; no organomegaly; no masses; normal bowel sounds.  EXTREMITIES: Distal pulses intact; no edema.  NEUROMUSCULAR: Normal cranial nerves; symmetric strength and tone.  MUSCULOSKELETAL: No joint deformities.  PSYCHIATRIC: Alert and oriented; normal affect.   LABORATORY:  Otherwise notable for glucose 325 and a troponin of 0.04 with  upper normal 0.03. CPK values are not available. Renal function is normal.  There is borderline hypokalemia with potassium of 3.7.   IMPRESSION:  Kristina Jones presents with recurrent chest discomfort and known  significant coronary disease. Her symptoms or certainly worrisome for  unstable angina. Initial treatment will include aspirin, a beta blocker,  full anticoagulation with enoxaparin, intravenous nitroglycerin and  clopidogrel. Prior medications will be resumed including an oral  hypoglycemic and a Statin. She will be treated with insulin coverage until  diabetes is controlled. We will obtain diabetic education for her. Serial  cardiac markers and EKGs as well as an echocardiogram will be obtained. She  will likely than require cardiac catheterization. The importance of medical  compliance and discontinuation of tobacco use was discussed with the  patient. Nicotine replacement therapy will be provided.      [place] Bing, M.D. [facility]  Electronically Signed     RR/MEDQ  D:  08/22/2005  T:  08/22/2005  Job:  [id number]

## 2011-03-16 NOTE — H&P (Signed)
[name], [name]:  [id number]   MEDICAL RECORD NO.:  [id number]                   PATIENT TYPE:  INP   LOCATION:  1824                                 FACILITY:  [facility]   PHYSICIAN:  Kristina Jones, M.D.               DATE OF BIRTH:  02/11/1934   DATE OF ADMISSION:  09/19/2003  DATE OF DISCHARGE:                                HISTORY & PHYSICAL   PRIMARY CARE DOCTOR:  Dr. Clelia Jones in [place].   CHIEF COMPLAINT:  Chest pain.   HISTORY OF PRESENT ILLNESS:  Patient is a 77 year old woman transferred from  [facility] with chest pain that began the night prior to  admission at 6:00 p.m. after dinner.  She was watching television and  developed substernal chest pressure with no radiation.  She did have  shortness of breath and diaphoresis but no nausea or vomiting.  This was  described as 8/10.  She attributed it to indigestion, and she also had a  recent cold.  She took three Tylenol and sat down.  It gradually went away  at midnight.  This morning, she felt well.  Around 10:30 in the morning, she  awoke and was walking upstairs at a flea market when she developed similar  symptoms that were described as 6/10 chest pain.  The family took her to the  ER, where she received three baby aspirin, a nitroglycerin patch, and IV  nitroglycerin.  Her pain was relieved to a 0/10.  She has been stable.  On  further history, the patient reports having similar chest pain symptoms for  2-3 weeks.   PAST MEDICAL HISTORY:  1. Non-insulin-dependent diabetes x5 years.  2. Retinopathy.  3. Peripheral neuropathy.  4. Hypertension.  Her blood pressures run 140-190/88-110.  5. Obesity.  6. Status post right arm fracture.  7. Status post left knee surgery.  8. Status post appendectomy.  9. Status post bilateral tubal ligation.  10.      Patient reports no history of MI, CVA, COPD, bleeding, thyroid     problems, or hyperlipidemia.   ALLERGIES:  No  known drug allergies.   MEDICATIONS:  1. Glucotrol, unknown dose, possibly 10 mg once a day.  2. Lisinopril, unknown dose, possibly 10 mg once a day.  3. Darvocet p.r.n.   SOCIAL HISTORY:  Patient lives in [place] alone.  She lives in a mobile home.  She works as a Financial risk analyst.  She has four children and 11 grandchildren.  She  smokes one pack per day.  No alcohol.  She does not have an exercise  program.  She follows a diabetic diet.   FAMILY HISTORY:  Her mother died at 49 of a rare kidney disease.  Her father  died at age 2 from a stroke.  Siblings:  She has one brother who has had an  MI and bypass surgery.  One  sister with an MI and bypass surgery.  Three  sisters who are dead from heart disease, diabetes, and cancer.   REVIEW OF SYSTEMS:  Negative except for:  HEENT:  Headache.  Poor hearing.  CARDIOPULMONARY:  She does have chest pain, shortness of breath, cough, and  wheezing.  MUSCULOSKELETAL:  She has lower extremity arthritis in her hips  and knees.  GI:  Occasional constipation.  All other review of systems are  negative.  She does have a recent left ankle sprain three weeks ago.   PHYSICAL EXAMINATION:  VITAL SIGNS:  Blood pressure 119/72, respirations 23,  pulse 61.  She is satting at 98% on two liters.  GENERAL:  She is a well-developed and well-nourished female in no apparent  distress.  HEENT:  Normocephalic and atraumatic.  PERRLA.  EOMI.  Oropharynx and oral  cavity are clear.  NECK:  No JVD.  No bruits.  Carotid pulses 2+.  No lymphadenopathy.  CARDIOVASCULAR:  Regular rate and rhythm.  No murmurs, rubs or gallops.  No  chest pain on palpation.  LUNGS:  Clear to auscultation bilaterally.  No wheezes, rales, or rhonchi.  SKIN:  She has no lesions.  ABDOMEN:  Soft, nontender, nondistended.  Normal bowel sounds.  No  hepatosplenomegaly.  EXTREMITIES:  No clubbing, cyanosis or edema.  She does have some  varicosities.  No bruits at the femoral sites bilaterally.   MUSCULOSKELETAL:  She does have some left lower extremity edema at the ankle  at the site of her previous ankle sprain.  NEUROLOGIC:  Intact.   Chest x-ray shows cardiomegaly.   EKG:  Normal sinus rhythm.  Rate 77.  QRS interval is 94.  No ST/T wave  changes.   LABS:  White count 6.5, hemoglobin 15, hematocrit 45, platelets 264.  Sodium  138, potassium 3.6, chloride 106, bicarb 27, BUN 19, creatinine 0.9, glucose  198, CK 56, MB 4.4.  Troponin I at 0.01.  PTT 26.5, PT 12.6, INR 1, MCV  93.1.  Calcium 8.5.   ASSESSMENT/PLAN:  Patient is a 77 year old woman with diabetes,  hypertension, obesity, who presents with progressive angina over the last  two weeks with increased symptoms over the last two days.  Symptoms are  fairly atypical.  Her enzymes thus far are negative.  We will admit her for  further evaluation.  Problem #1:  Unstable angina:  We will rule her out for a myocardial  infarction and add a beta blocker.  Plan for catheterization in the morning.  Pre-cath orders have been placed on the chart.  She also is currently on IV  heparin, ACE inhibitor, and IV nitroglycerin.  Problem #2:  Diabetes:  We will continue her on 10 mg of Glucotrol and  follow her blood sugars carefully.  We will request a diabetes consult.  If  her blood sugars are elevated, we will adjust her medication as needed.  Of  course, she will be n.p.o. in the morning for her cardiac catheterization.  Problem #3:  Obesity:  The patient recently lost over 40 pounds on the  Atkins Diet.  She mainly did this for glucose control.  Problem #4:  Hypertension:  The patient seems to have had poorly controlled  blood pressure.  We will follow her blood pressure in-house and adjustment  as needed.  Problem #5:  Risks and procedure for the catheterization have been described  to the patient.  She is aware of what will happen during a catheterization. She will  have further questions addressed by [place] in the  morning.     Kristina Jones, M.D. [facility]                Kristina Jones, M.D.   DWM/MEDQ  D:  09/19/2003  T:  09/19/2003  Job:  [id number]

## 2011-03-16 NOTE — Op Note (Signed)
NAME:  Kristina Jones, Kristina Jones                 ACCOUNT NO.:  [id number]   MEDICAL RECORD NO.:  [id number]          PATIENT TYPE:  INP   LOCATION:  2311                         FACILITY:  [facility]   PHYSICIAN:  Salvatore Decent. Hendrickson, M.D.DATE OF BIRTH:  1934/05/07   DATE OF PROCEDURE:  11/05/2005  DATE OF DISCHARGE:                                 OPERATIVE REPORT   PREOPERATIVE DIAGNOSIS:  Severe three-vessel coronary disease with in-stent  restenosis in previously stented arteries with unstable angina.   POSTOPERATIVE DIAGNOSIS:  Severe three-vessel coronary disease with in-stent  restenosis in previously stented arteries with unstable angina.   PROCEDURE:  Median sternotomy, extracorporeal circulation, coronary artery  bypass grafting x4 (left internal mammary artery to the left anterior  descending, saphenous vein to the first diagonal, saphenous vein graft to  obtuse marginal 1, saphenous vein graft to posterior descending), endoscopic  vein harvest, right leg.   SURGEON:  Salvatore Decent. Dorris Fetch, MD   ASSISTANT:  Sheliah Plane, MD   SECOND ASSISTANT:  Stephanie Acre Dominick, PA   ANESTHESIA:  General.   FINDINGS:  Diagonal a poor-quality target, posterior descending a fair-  quality target, OM-1 and LAD good-quality targets, vein fair, mammary good.   CLINICAL NOTE:  Ms. Kristina Jones is a 77 year old lady with diabetes and multiple  other cardiac risk factor.  She presented with unstable angina back in  October and was treated at time with stents in her LAD and circumflex.  She  now presents with recurrent stable angina with in-stent restenosis.  The  patient had been on Plavix prior to admission.,  She was advised to undergo  coronary bypass grafting.  The indications, risks, benefits and alternatives  were discussed in detail with the patient.  After waiting to the patient to  be off with Plavix for 6 days to allow the platelet population to  repopulate, she was ready to proceed with coronary  bypass grafting.  She did  understand the risk of the surgery and agreed to proceed.   OPERATIVE NOTE:  Ms. Kristina Jones was brought to the preop holding area on November 05, 2005; there, lines were placed by Anesthesia to monitor arterial, central  venous and pulmonary arterial pressure.  Intravenous antibiotics were  administered and she was taken to the operating room, anesthetized and  intubated.  A Foley catheter was placed.  The chest, abdomen and legs were  prepped and draped in the usual fashion.   An incision was made in the medial aspect of the right leg at the level of  the knee.  The greater saphenous vein was harvested endoscopically from the  groin to the mid-calf.  The vein was of fair quality.  Simultaneously, a  median sternotomy was performed and the left internal mammary artery was  harvested using standard technique.  The mammary was of good quality.  Five  thousand units of heparin were administered during the harvest of the  vessels and the remainder of the full heparin dose was given prior to  dividing the distal end of the left mammary artery.   The pericardium was  opened.  The ascending aorta was inspected and palpated;  it was of normal size and caliber; there was no palpable atherosclerotic  disease.  The aorta was cannulated via concentric 2-0 Ethibond pledgeted  pursestring sutures.  A dual-stage venous cannula was placed via a  pursestring suture in the right atrial appendage.  Cardiopulmonary bypass  was instituted and the patient was cooled to 32 degrees Celsius.  The  coronary arteries were inspected and anastomotic sites were chosen.  The  conduits were inspected and cut to length.  A foam pad was placed in the  pericardium to protect the left phrenic nerve.  A temperature probe was  placed in the myocardial septum and a cardioplegic cannula was placed in the  ascending aorta.   The aorta was crossclamped.  The left ventricle was emptied via the aortic  root  vent.  Cardiac arrest then was achieved with a combination of cold  antegrade blood cardioplegia and topical iced saline.  Five hundred  milliliters of cardioplegia were administered.  The myocardial septal  temperature was less than 10 degrees Celsius.  The following distal  anastomoses were performed.   First, a reversed saphenous vein grafts was placed end-to-side to the  posterior descending branch of the right coronary.  The posterior descending  was totally occluded.  The posterior descending was a 1.5-mm vessel, but did  have some diffuse disease and only a 1-mm probe passed distally.  The vein  graft was of fair quality.  It was anastomosed end-to-side with a running 7-  0 Prolene suture.  All anastomoses were probed at their completion; they  were then flushed with iced saline and then cardioplegia was administered  through each vein at its completion.   Next, a reversed saphenous vein grafts was placed end-to-side to OM-1.  This  was a 1.5-mm good-quality target.  The vein was of fair-quality.  The  anastomosis was performed with a running 7-0 Prolene suture.   Next, a reverse saphenous vein graft was placed end-to-side to the first  diagonal.  This was a 1-mm poor-quality target.  The vein was of fair  quality.  The anastomosis was performed with running 7-0 Prolene suture.  A  1-mm probe did pass through the anastomosis at its completion.   Next, the left internal mammary arteries was brought through a window in the  pericardium.  It was then cut to length and the distal end was spatulated.  The LAD was intramyocardial beyond the takeoff of the second diagonal  branch.  It was grafted just beyond the takeoff of the second diagonal,  where it was relatively superficially intramyocardial.  The anastomosis was  performed end-to-side with a running 8-0 Prolene suture.  At the completion of the mammary-to-LAD anastomosis, the bulldog clamp was removed from the  mammary artery.   Rapid septal rewarming was noted.  The bulldog clamp was  replaced and the mammary pedicle was tacked to the epicardial surface of the  heart with 6-0 Prolene sutures.   While the patient was being rewarmed, the proximal anastomoses were  performed to 4.0-mm punch aortotomies with running 6-0 Prolene sutures.  At  the completion of the final proximal anastomosis, the patient was placed in  Trendelenburg position.  De-airing maneuvers were performed.  Lidocaine was  administered and the aortic crossclamp was removed; the total crossclamp  time was 72 minutes.  The patient was initially in heart block, but did not  require defibrillation.   All proximal and  distal anastomoses were inspected for hemostasis.  Epicardial pacing wires were placed on the right ventricle and right atrium,  and DDD pacing was initiated.  A low-dose dopamine infusion was initiated.  When the patient had rewarmed to a core temperature of 37 degrees Celsius,  she weaned from cardiopulmonary bypass on the first attempt without  difficulty.  The total bypass time was 108 minutes.   The initial cardiac output was around 4 L per minute and the patient  remained hemodynamically stable throughout the post-bypass period.  A test  dose protamine was administered and was well-tolerated.  The atrial and  aortic cannulae were removed.  The remainder of the protamine was  administered without incident.  The chest was irrigated with 1 L of warm  normal saline containing 1 g of vancomycin and hemostasis was achieved.  The  pericardium was reapproximated over the ascending aorta and the base of the  heart with interrupted 3-0 silk sutures; it came together easily without  tension.  The sternum was closed with interrupted heavy-gauge stainless  steel wires.  The remainder of the incision was closed in standard fashion.  All sponge, needle and instrument Jones were correct at the end of the  procedure.  The patient was taken from  the operating room to the surgical  intensive care unit in critical but stable condition.           ______________________________  Salvatore Decent Dorris Fetch, M.D.     SCH/MEDQ  D:  11/05/2005  T:  11/06/2005  Job:  [id number]   cc:   Jonelle Sidle, M.D. [facility]  [address]  [place]  [place] [address]

## 2011-03-16 NOTE — Consult Note (Signed)
NAME:  LORENIA, HOSTON                 ACCOUNT NO.:  [id number]   MEDICAL RECORD NO.:  [id number]          PATIENT TYPE:  INP   LOCATION:  2015                         FACILITY:  [facility]   PHYSICIAN:  Salvatore Decent. Dorris Fetch, M.D.DATE OF BIRTH:  April 30, 1934   DATE OF CONSULTATION:  10/30/2005  DATE OF DISCHARGE:                                   CONSULTATION   REASON FOR CONSULTATION:  Three vessel coronary artery disease.   HISTORY OF PRESENT ILLNESS:  Ms. Muhlestein is a 77 year old female who has a  history of coronary artery disease, diabetes, hypertension, hyperlipidemia,  tobacco abuse and COPD, who was seen and treated at [facility] in  October 2006 with a non-ST elevation MI.  She had three of three vessel  disease with a chronically totaled right coronary artery, she had  significant proximal LAD and circumflex disease.  She was treated with  angioplasty with bare metal stents.  This was used due to a history of  noncompliance.  She was treated with Plavix and has been compliant with the  Plavix since it was started.  She now presents with two weeks of neck and  jaw discomfort.  She had been taking sublingual nitroglycerin for this job  discomfort.  She had some shortness of breath as well as a couple of  episodes of diaphoresis in association with these episodes.  She has had  episodes at rest.  She was admitted and underwent cardiac catheterization  which revealed, again, severe three vessel coronary artery disease with in  stent and peri-stent disease as well as distal left main disease.  Her  ejection fraction was 60%, the distal left main was approximately 60-70%  stenosed.  She had an episode of pain post catheterization which resolved  with intravenous nitroglycerin.   PAST MEDICAL HISTORY:  Significant for coronary artery disease, previous MI,  hyperlipidemia, hypertension, type 2 adult onset insulin dependent diabetes,  tobacco abuse, COPD, chronic dyspnea on exertion,  probable obstructive sleep  apnea, previous knee surgery.   MEDICATIONS ON ADMISSION:  Gemfibrozil 600 mg b.i.d., Plavix 75 mg daily,  Simvastatin 80 mg daily, Glimepiride 4 mg daily, Metoprolol 25 mg t.i.d.,  aspirin 325 mg daily, Lisinopril daily, Combivent inhaler p.r.n., Lantus  insulin 10 units daily, and Darvocet p.r.n.   ALLERGIES:  She has no true drug allergies but does have a history of  intolerance to morphine, Dilaudid, and Stadol.   FAMILY HISTORY:  Father had a history of atherosclerotic cardiovascular  disease and stroke.  She has a sister who died from cardiac disease, one  died from diabetes, and one from cancer.  She has a brother and sister who  have had MI and bypass surgery.   SOCIAL HISTORY:  She lives with her daughter in [place].  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  She does not currently work and she does not drink alcohol.  She  smoked tobacco up until October of this year but has quit since that time.   REVIEW OF SYMPTOMS:  Decreased energy, anxiety, decreased hearing, numbness  in her  feet, reflux, arthritis in her knee, all other systems are negative.   PHYSICAL EXAMINATION:  GENERAL:  Ms. Lint is a 77 year old female, she is anxious but in no acute  distress.  VITAL SIGNS:  Blood pressure 126/58, heart rate 80, respirations 16.  NEUROLOGICAL:  Alert and oriented x 3 with no focal motor deficits.  HEENT:  Unremarkable.  NECK:  Without thyromegaly, adenopathy, or bruits.  CARDIAC:  Regular rate and rhythm, normal S1 and S2, no murmurs, gallops,  and rubs.  LUNGS:  Mild expiratory wheezing bilaterally.  ABDOMEN:  Soft, nontender.  EXTREMITIES:  No cyanosis, clubbing, and edema.  I am not able to palpate  dorsalis pedes or posterior tibial pulses bilaterally.  She does have  varicosities.  SKIN:  Warm and dry.   LABORATORY DATA:  EKG showed lateral ischemia.  Her sodium is 136, potassium  4.5, glucose 287 on admission, BUN and creatinine 24 and 0.9, white  count  5.3, hematocrit 39, platelets 253.  Protime 14.1 with an INR 1.1, PTT 40.  LFTs within normal limits.  Her CK was 41 on admission with an MB 1.9, her  troponin was 0.05.  Cholesterol 164, HDL 43, triglycerides 215.   IMPRESSION:  Ms. Kalan is a 77 year old female who presented with a non-Q  wave MI in October.  She was treated with bare metal stenting to her LAD and  circumflex and now comes back with recurrent angina and significant in stent  and peri-stent disease.  She has a chronically totaled right coronary with  left to right collaterals but a poor appearing vessel in that area.  Coronary artery bypass grafting is indicated for survival benefits as well  as relief of symptoms.  Unfortunately, this is more complicated in this  situation by her Plavix use, proceeding with coronary artery bypass grafting  immediately would be complicated by a significant increase in the risks of  bleeding, the need for blood transfusions, potentially massive and related  morbidities with those problems.  The current standard of care with Plavix  is to wait 5-7 days prior to surgery if the patient can be managed medically  in that interval.  It is unclear with this lady whether she will be able to  be managed medically during that time.  If she continues to have problems  despite maximal medical therapy, we may be forced to proceed with surgery  and accept the significantly increased risk.  I had a long discussion with  the patient and her daughter and discussed with them the nature of her  disease, the indications, risks, benefits, and alternative treatments.  They  understand that the risks include but are not limited to death, stroke, MI,  DVT, PE, bleeding, possible need for transfusions, infection, as well as  other organ dysfunction including respiratory, renal, or GI complications. She understands and accepts these risks and agrees to proceed with the  surgery.            ______________________________  Salvatore Decent Dorris Fetch, M.D.     SCH/MEDQ  D:  10/30/2005  T:  10/30/2005  Job:  [id number]   cc:   Charlies Constable, M.D. [facility]  [address]  [place]  [place] [address]   Jonelle Sidle, M.D. [facility]  [address]  [place]  [place] [address]   [name], MD  Fax: [phone]

## 2012-02-18 DIAGNOSIS — R079 Chest pain, unspecified: Secondary | ICD-10-CM

## 2012-02-19 DIAGNOSIS — I2510 Atherosclerotic heart disease of native coronary artery without angina pectoris: Secondary | ICD-10-CM

## 2012-02-25 ENCOUNTER — Other Ambulatory Visit: Payer: Self-pay | Admitting: Cardiology

## 2012-02-25 DIAGNOSIS — I2510 Atherosclerotic heart disease of native coronary artery without angina pectoris: Secondary | ICD-10-CM

## 2012-02-26 DIAGNOSIS — R072 Precordial pain: Secondary | ICD-10-CM

## 2012-02-27 ENCOUNTER — Telehealth: Payer: Self-pay | Admitting: *Deleted

## 2012-02-27 NOTE — Telephone Encounter (Signed)
Pt notified of results and verbalized understanding. Confirmed upcoming appt w/Dr. Diona Browner.

## 2012-02-27 NOTE — Telephone Encounter (Signed)
Message copied by Arlyss Gandy on Wed Feb 27, 2012  2:42 PM ------      Message from: MCDOWELL, Illene Bolus      Created: Wed Feb 27, 2012 10:15 AM       Reviewed report. Evidence of either attenuation versus scar in the apical anteroseptal and inferolateral distribution, LVEF normal however at 66%. No definite ischemia. Patient has known CAD based on records. Can likely be managed medically at this point based on test results. She should have a followup visit scheduled from her recent consultation at [facility].

## 2012-03-19 ENCOUNTER — Ambulatory Visit (INDEPENDENT_AMBULATORY_CARE_PROVIDER_SITE_OTHER): Payer: Medicare Other | Admitting: Cardiology

## 2012-03-19 ENCOUNTER — Encounter: Payer: Self-pay | Admitting: *Deleted

## 2012-03-19 VITALS — BP 138/72 | HR 85 | Resp 16 | Ht 61.0 in | Wt 190.0 lb

## 2012-03-19 DIAGNOSIS — I2510 Atherosclerotic heart disease of native coronary artery without angina pectoris: Secondary | ICD-10-CM

## 2012-03-19 DIAGNOSIS — E782 Mixed hyperlipidemia: Secondary | ICD-10-CM

## 2012-03-19 DIAGNOSIS — I10 Essential (primary) hypertension: Secondary | ICD-10-CM

## 2012-03-19 NOTE — Patient Instructions (Signed)

## 2012-03-19 NOTE — Assessment & Plan Note (Signed)
Blood pressure control is reasonable today. No changes made. 

## 2012-03-19 NOTE — Assessment & Plan Note (Signed)
History reviewed above. Recent Myoview shows no frank ischemia, plan to continue medical therapy and observation for now. Six-month followup arranged.

## 2012-03-19 NOTE — Assessment & Plan Note (Signed)
Followed by Dr. Olena Leatherwood. She is on multi-mode of therapy including omega-3, gemfibrozil, simvastatin. Can review followup lab work when she has this checked at her routine visits. Would be on the lookout for possible side effects.

## 2012-03-19 NOTE — Progress Notes (Signed)
Clinical Summary Ms. Tonner is a 78 y.o.female presenting to the office for post hospital followup visit. She was seen at [facility] back in April by Dr. Myrtis Ser in the setting of right arm pain that was felt to be musculoskeletal rather than ischemic. She ruled out for myocardial infarction. Echocardiogram demonstrated LVEF of 55-60% with mild LVH, diastolic dysfunction, inferolateral hypokinesis, mild mitral regurgitation.  She was subsequent referred for a followup Myoview which demonstrated evidence of attenuation versus scar in the apical anteroseptal and inferolateral distribution, LVEF of 66%. There was no definite ischemia.  She is here with her daughter today. Reports no angina. Remains functionally limited, uses a wheelchair most of the time, also oxygen as needed.  We reviewed her cardiac regimen and recent Myoview results. Plan to continue observation for now.   Allergies  Allergen Reactions  . Butorphanol   . Levaquin (Levofloxacin In D5w)   . Morphine And Related   . Nitrofuran Derivatives   . Quinolones     Current Outpatient Prescriptions  Medication Sig Dispense Refill  . ALPRAZolam (XANAX) 0.5 MG tablet Take 0.5 mg by mouth 2 (two) times daily.      Marland Kitchen amitriptyline (ELAVIL) 50 MG tablet Take 50 mg by mouth at bedtime.      Marland Kitchen aspirin-acetaminophen-caffeine (EXCEDRIN MIGRAINE) 250-250-65 MG per tablet Take 1 tablet by mouth as needed.      . Aspirin-Dipyridamole (AGGRENOX PO) Take by mouth 2 (two) times daily.      . citalopram (CELEXA) 20 MG tablet Take 20 mg by mouth daily.      . furosemide (LASIX) 40 MG tablet Take 40 mg by mouth daily.      Marland Kitchen gemfibrozil (LOPID) 600 MG tablet Take 600 mg by mouth 2 (two) times daily before a meal.      . glimepiride (AMARYL) 4 MG tablet Take 4 mg by mouth daily before breakfast.      . GuaiFENesin (MUCINEX PO) Take by mouth 2 (two) times daily.      . insulin glargine (LANTUS) 100 UNIT/ML injection Inject into the skin as directed.       . Insulin Lispro Prot & Lispro (HUMALOG MIX 75/25 ) Inject into the skin as directed.      Marland Kitchen l-methylfolate-B6-B12 (METANX) 3-35-2 MG TABS Take 1 tablet by mouth daily.      Marland Kitchen lidocaine (LIDODERM) 5 % Place 1 patch onto the skin as needed. Remove & Discard patch within 12 hours or as directed by MD      . metoprolol tartrate (LOPRESSOR) 25 MG tablet Take 25 mg by mouth 3 (three) times daily.      . Omega-3 Fatty Acids (FISH OIL PO) Take by mouth 3 (three) times daily.      Marland Kitchen oxyCODONE-acetaminophen (PERCOCET) 7.5-325 MG per tablet Take 1 tablet by mouth every 8 (eight) hours as needed.      . simvastatin (ZOCOR) 80 MG tablet Take 80 mg by mouth at bedtime.        Past Medical History  Diagnosis Date  . COPD (chronic obstructive pulmonary disease)     Supplemental oxygen  . CVA (cerebrovascular accident)   . Essential hypertension, benign   . Type 2 diabetes mellitus   . Hyperlipidemia   . Coronary atherosclerosis of native coronary artery     Multivessel, BMS to LAD and circumflex 2006  . History of non-ST elevation myocardial infarction (NSTEMI) 2007  . Peripheral arterial disease   . Peripheral neuropathy  Past Surgical History  Procedure Date  . Coronary artery bypass graft 2007    Dr. Dorris Fetch - LIMA to LAD, SVG to diagonal, SVG to OM, SVG to PDA  . Tubal ligation   . Appendectomy   . Right elbow surgery     Family History  Problem Relation Age of Onset  . Coronary artery disease      Social History Ms. Chirino reports that she has quit smoking. Her smoking use included Cigarettes. She has never used smokeless tobacco. Ms. Martes reports that she does not drink alcohol.  Review of Systems No palpitations. No orthopnea. Does have intermittent lower extremity edema, worse when she has increased sodium in her diet. Also states that she is relatively unsteady on her feet, has had falls in the past. Otherwise negative.  Physical Examination Filed Vitals:    03/19/12 0850  BP: 138/72  Pulse: 85  Resp: 16   Chronically ill appearing woman, no acute distress. HEENT: Conjunctiva and lids normal, oropharynx with poor dentition. Neck: Supple, no elevated JVP or carotid bruits, no thyromegaly. Lungs: Clear to auscultation, nonlabored breathing at rest. Cardiac: Regular rate and rhythm, no S3, soft systolic murmur, no pericardial rub. Abdomen: Soft, nontender, bowel sounds present, no guarding or rebound. Extremities: Trace edema, venous stasis, distal pulses 1+. Skin: Warm and dry. Musculoskeletal: No kyphosis. Neuropsychiatric: Alert and oriented x3, affect grossly appropriate.   Problem List and Plan   Coronary atherosclerosis of native coronary artery History reviewed above. Recent Myoview shows no frank ischemia, plan to continue medical therapy and observation for now. Six-month followup arranged.  Essential hypertension, benign Blood pressure control is reasonable today. No changes made.  Mixed hyperlipidemia Followed by Dr. Olena Leatherwood. She is on multi-mode of therapy including omega-3, gemfibrozil, simvastatin. Can review followup lab work when she has this checked at her routine visits. Would be on the lookout for possible side effects.     Jonelle Sidle, M.D., F.A.C.C.

## 2015-11-25 DIAGNOSIS — I69351 Hemiplegia and hemiparesis following cerebral infarction affecting right dominant side: Secondary | ICD-10-CM | POA: Diagnosis not present

## 2015-11-25 DIAGNOSIS — E1121 Type 2 diabetes mellitus with diabetic nephropathy: Secondary | ICD-10-CM | POA: Diagnosis not present

## 2015-11-25 DIAGNOSIS — J441 Chronic obstructive pulmonary disease with (acute) exacerbation: Secondary | ICD-10-CM | POA: Diagnosis not present

## 2016-02-27 DIAGNOSIS — E1121 Type 2 diabetes mellitus with diabetic nephropathy: Secondary | ICD-10-CM | POA: Diagnosis not present

## 2016-02-27 DIAGNOSIS — E1122 Type 2 diabetes mellitus with diabetic chronic kidney disease: Secondary | ICD-10-CM | POA: Diagnosis not present

## 2016-02-27 DIAGNOSIS — Z0000 Encounter for general adult medical examination without abnormal findings: Secondary | ICD-10-CM | POA: Diagnosis not present

## 2016-02-27 DIAGNOSIS — Z79899 Other long term (current) drug therapy: Secondary | ICD-10-CM | POA: Diagnosis not present

## 2016-03-06 DIAGNOSIS — Z1231 Encounter for screening mammogram for malignant neoplasm of breast: Secondary | ICD-10-CM | POA: Diagnosis not present

## 2016-03-21 DIAGNOSIS — M810 Age-related osteoporosis without current pathological fracture: Secondary | ICD-10-CM | POA: Diagnosis not present

## 2016-03-21 DIAGNOSIS — Z7984 Long term (current) use of oral hypoglycemic drugs: Secondary | ICD-10-CM | POA: Diagnosis not present

## 2016-03-21 DIAGNOSIS — M069 Rheumatoid arthritis, unspecified: Secondary | ICD-10-CM | POA: Diagnosis not present

## 2016-03-21 DIAGNOSIS — Z79899 Other long term (current) drug therapy: Secondary | ICD-10-CM | POA: Diagnosis not present

## 2016-03-21 DIAGNOSIS — Z780 Asymptomatic menopausal state: Secondary | ICD-10-CM | POA: Diagnosis not present

## 2016-03-21 DIAGNOSIS — E119 Type 2 diabetes mellitus without complications: Secondary | ICD-10-CM | POA: Diagnosis not present

## 2016-03-21 DIAGNOSIS — Z7982 Long term (current) use of aspirin: Secondary | ICD-10-CM | POA: Diagnosis not present

## 2016-03-21 DIAGNOSIS — M1990 Unspecified osteoarthritis, unspecified site: Secondary | ICD-10-CM | POA: Diagnosis not present

## 2016-03-21 DIAGNOSIS — I10 Essential (primary) hypertension: Secondary | ICD-10-CM | POA: Diagnosis not present

## 2016-03-21 DIAGNOSIS — C539 Malignant neoplasm of cervix uteri, unspecified: Secondary | ICD-10-CM | POA: Diagnosis not present

## 2016-03-21 DIAGNOSIS — J449 Chronic obstructive pulmonary disease, unspecified: Secondary | ICD-10-CM | POA: Diagnosis not present

## 2016-03-21 DIAGNOSIS — Z794 Long term (current) use of insulin: Secondary | ICD-10-CM | POA: Diagnosis not present

## 2016-04-03 DIAGNOSIS — M810 Age-related osteoporosis without current pathological fracture: Secondary | ICD-10-CM | POA: Diagnosis not present

## 2016-05-29 DIAGNOSIS — E1122 Type 2 diabetes mellitus with diabetic chronic kidney disease: Secondary | ICD-10-CM | POA: Diagnosis not present

## 2016-05-29 DIAGNOSIS — Z0000 Encounter for general adult medical examination without abnormal findings: Secondary | ICD-10-CM | POA: Diagnosis not present

## 2016-05-29 DIAGNOSIS — L308 Other specified dermatitis: Secondary | ICD-10-CM | POA: Diagnosis not present

## 2016-05-29 DIAGNOSIS — J440 Chronic obstructive pulmonary disease with acute lower respiratory infection: Secondary | ICD-10-CM | POA: Diagnosis not present

## 2016-05-29 DIAGNOSIS — Z1389 Encounter for screening for other disorder: Secondary | ICD-10-CM | POA: Diagnosis not present

## 2016-05-29 DIAGNOSIS — C3401 Malignant neoplasm of right main bronchus: Secondary | ICD-10-CM | POA: Diagnosis not present

## 2016-08-06 DIAGNOSIS — J441 Chronic obstructive pulmonary disease with (acute) exacerbation: Secondary | ICD-10-CM | POA: Diagnosis not present

## 2016-08-06 DIAGNOSIS — E1122 Type 2 diabetes mellitus with diabetic chronic kidney disease: Secondary | ICD-10-CM | POA: Diagnosis not present

## 2016-08-20 DIAGNOSIS — I63441 Cerebral infarction due to embolism of right cerebellar artery: Secondary | ICD-10-CM | POA: Diagnosis not present

## 2016-08-20 DIAGNOSIS — R531 Weakness: Secondary | ICD-10-CM | POA: Diagnosis not present

## 2016-08-20 DIAGNOSIS — N390 Urinary tract infection, site not specified: Secondary | ICD-10-CM | POA: Diagnosis not present

## 2016-08-20 DIAGNOSIS — I6349 Cerebral infarction due to embolism of other cerebral artery: Secondary | ICD-10-CM | POA: Diagnosis not present

## 2016-08-20 DIAGNOSIS — N184 Chronic kidney disease, stage 4 (severe): Secondary | ICD-10-CM | POA: Diagnosis not present

## 2016-08-21 DIAGNOSIS — I129 Hypertensive chronic kidney disease with stage 1 through stage 4 chronic kidney disease, or unspecified chronic kidney disease: Secondary | ICD-10-CM | POA: Diagnosis not present

## 2016-08-21 DIAGNOSIS — R531 Weakness: Secondary | ICD-10-CM | POA: Diagnosis not present

## 2016-08-21 DIAGNOSIS — I63441 Cerebral infarction due to embolism of right cerebellar artery: Secondary | ICD-10-CM | POA: Diagnosis not present

## 2016-08-21 DIAGNOSIS — I6349 Cerebral infarction due to embolism of other cerebral artery: Secondary | ICD-10-CM | POA: Diagnosis not present

## 2016-08-21 DIAGNOSIS — K449 Diaphragmatic hernia without obstruction or gangrene: Secondary | ICD-10-CM | POA: Diagnosis not present

## 2016-08-21 DIAGNOSIS — D631 Anemia in chronic kidney disease: Secondary | ICD-10-CM | POA: Diagnosis not present

## 2016-08-21 DIAGNOSIS — R296 Repeated falls: Secondary | ICD-10-CM | POA: Diagnosis not present

## 2016-08-21 DIAGNOSIS — Z79899 Other long term (current) drug therapy: Secondary | ICD-10-CM | POA: Diagnosis not present

## 2016-08-21 DIAGNOSIS — Z955 Presence of coronary angioplasty implant and graft: Secondary | ICD-10-CM | POA: Diagnosis not present

## 2016-08-21 DIAGNOSIS — Z881 Allergy status to other antibiotic agents status: Secondary | ICD-10-CM | POA: Diagnosis not present

## 2016-08-21 DIAGNOSIS — A0472 Enterocolitis due to Clostridium difficile, not specified as recurrent: Secondary | ICD-10-CM | POA: Diagnosis not present

## 2016-08-21 DIAGNOSIS — Z794 Long term (current) use of insulin: Secondary | ICD-10-CM | POA: Diagnosis not present

## 2016-08-21 DIAGNOSIS — E1169 Type 2 diabetes mellitus with other specified complication: Secondary | ICD-10-CM | POA: Diagnosis not present

## 2016-08-21 DIAGNOSIS — Z8673 Personal history of transient ischemic attack (TIA), and cerebral infarction without residual deficits: Secondary | ICD-10-CM | POA: Diagnosis not present

## 2016-08-21 DIAGNOSIS — E1122 Type 2 diabetes mellitus with diabetic chronic kidney disease: Secondary | ICD-10-CM | POA: Diagnosis not present

## 2016-08-21 DIAGNOSIS — I69928 Other speech and language deficits following unspecified cerebrovascular disease: Secondary | ICD-10-CM | POA: Diagnosis not present

## 2016-08-21 DIAGNOSIS — I2510 Atherosclerotic heart disease of native coronary artery without angina pectoris: Secondary | ICD-10-CM | POA: Diagnosis not present

## 2016-08-21 DIAGNOSIS — J449 Chronic obstructive pulmonary disease, unspecified: Secondary | ICD-10-CM | POA: Diagnosis not present

## 2016-08-21 DIAGNOSIS — Z780 Asymptomatic menopausal state: Secondary | ICD-10-CM | POA: Diagnosis not present

## 2016-08-21 DIAGNOSIS — E785 Hyperlipidemia, unspecified: Secondary | ICD-10-CM | POA: Diagnosis not present

## 2016-08-21 DIAGNOSIS — I10 Essential (primary) hypertension: Secondary | ICD-10-CM | POA: Diagnosis not present

## 2016-08-21 DIAGNOSIS — G8194 Hemiplegia, unspecified affecting left nondominant side: Secondary | ICD-10-CM | POA: Diagnosis not present

## 2016-08-21 DIAGNOSIS — N184 Chronic kidney disease, stage 4 (severe): Secondary | ICD-10-CM | POA: Diagnosis not present

## 2016-08-21 DIAGNOSIS — N390 Urinary tract infection, site not specified: Secondary | ICD-10-CM | POA: Diagnosis not present

## 2016-08-21 DIAGNOSIS — Z8249 Family history of ischemic heart disease and other diseases of the circulatory system: Secondary | ICD-10-CM | POA: Diagnosis not present

## 2016-08-21 DIAGNOSIS — Z79891 Long term (current) use of opiate analgesic: Secondary | ICD-10-CM | POA: Diagnosis not present

## 2016-08-21 DIAGNOSIS — Z87891 Personal history of nicotine dependence: Secondary | ICD-10-CM | POA: Diagnosis not present

## 2016-08-21 DIAGNOSIS — I63233 Cerebral infarction due to unspecified occlusion or stenosis of bilateral carotid arteries: Secondary | ICD-10-CM | POA: Diagnosis not present

## 2016-08-21 DIAGNOSIS — I69922 Dysarthria following unspecified cerebrovascular disease: Secondary | ICD-10-CM | POA: Diagnosis not present

## 2016-08-21 DIAGNOSIS — G8191 Hemiplegia, unspecified affecting right dominant side: Secondary | ICD-10-CM | POA: Diagnosis not present

## 2016-08-21 DIAGNOSIS — Z886 Allergy status to analgesic agent status: Secondary | ICD-10-CM | POA: Diagnosis not present

## 2016-08-21 DIAGNOSIS — M6281 Muscle weakness (generalized): Secondary | ICD-10-CM | POA: Diagnosis not present

## 2016-08-21 DIAGNOSIS — R2689 Other abnormalities of gait and mobility: Secondary | ICD-10-CM | POA: Diagnosis not present

## 2016-08-21 DIAGNOSIS — Z888 Allergy status to other drugs, medicaments and biological substances status: Secondary | ICD-10-CM | POA: Diagnosis not present

## 2016-08-24 DIAGNOSIS — I10 Essential (primary) hypertension: Secondary | ICD-10-CM | POA: Diagnosis not present

## 2016-08-24 DIAGNOSIS — M6281 Muscle weakness (generalized): Secondary | ICD-10-CM | POA: Diagnosis not present

## 2016-08-24 DIAGNOSIS — I700 Atherosclerosis of aorta: Secondary | ICD-10-CM | POA: Diagnosis not present

## 2016-08-24 DIAGNOSIS — Z7901 Long term (current) use of anticoagulants: Secondary | ICD-10-CM | POA: Diagnosis not present

## 2016-08-24 DIAGNOSIS — E1165 Type 2 diabetes mellitus with hyperglycemia: Secondary | ICD-10-CM | POA: Diagnosis not present

## 2016-08-24 DIAGNOSIS — B9620 Unspecified Escherichia coli [E. coli] as the cause of diseases classified elsewhere: Secondary | ICD-10-CM | POA: Diagnosis not present

## 2016-08-24 DIAGNOSIS — N390 Urinary tract infection, site not specified: Secondary | ICD-10-CM | POA: Diagnosis not present

## 2016-08-24 DIAGNOSIS — Z794 Long term (current) use of insulin: Secondary | ICD-10-CM | POA: Diagnosis not present

## 2016-08-24 DIAGNOSIS — A0472 Enterocolitis due to Clostridium difficile, not specified as recurrent: Secondary | ICD-10-CM | POA: Diagnosis not present

## 2016-08-24 DIAGNOSIS — Z79899 Other long term (current) drug therapy: Secondary | ICD-10-CM | POA: Diagnosis not present

## 2016-08-24 DIAGNOSIS — I509 Heart failure, unspecified: Secondary | ICD-10-CM | POA: Diagnosis not present

## 2016-08-24 DIAGNOSIS — I6349 Cerebral infarction due to embolism of other cerebral artery: Secondary | ICD-10-CM | POA: Diagnosis not present

## 2016-08-24 DIAGNOSIS — I69922 Dysarthria following unspecified cerebrovascular disease: Secondary | ICD-10-CM | POA: Diagnosis not present

## 2016-08-24 DIAGNOSIS — R0682 Tachypnea, not elsewhere classified: Secondary | ICD-10-CM | POA: Diagnosis not present

## 2016-08-24 DIAGNOSIS — Z881 Allergy status to other antibiotic agents status: Secondary | ICD-10-CM | POA: Diagnosis not present

## 2016-08-24 DIAGNOSIS — I5021 Acute systolic (congestive) heart failure: Secondary | ICD-10-CM | POA: Diagnosis not present

## 2016-08-24 DIAGNOSIS — I2510 Atherosclerotic heart disease of native coronary artery without angina pectoris: Secondary | ICD-10-CM | POA: Diagnosis not present

## 2016-08-24 DIAGNOSIS — J439 Emphysema, unspecified: Secondary | ICD-10-CM | POA: Diagnosis not present

## 2016-08-24 DIAGNOSIS — Z8673 Personal history of transient ischemic attack (TIA), and cerebral infarction without residual deficits: Secondary | ICD-10-CM | POA: Diagnosis not present

## 2016-08-24 DIAGNOSIS — I4891 Unspecified atrial fibrillation: Secondary | ICD-10-CM | POA: Diagnosis not present

## 2016-08-24 DIAGNOSIS — E1169 Type 2 diabetes mellitus with other specified complication: Secondary | ICD-10-CM | POA: Diagnosis not present

## 2016-08-24 DIAGNOSIS — J989 Respiratory disorder, unspecified: Secondary | ICD-10-CM | POA: Diagnosis not present

## 2016-08-24 DIAGNOSIS — J441 Chronic obstructive pulmonary disease with (acute) exacerbation: Secondary | ICD-10-CM | POA: Diagnosis not present

## 2016-08-24 DIAGNOSIS — I130 Hypertensive heart and chronic kidney disease with heart failure and stage 1 through stage 4 chronic kidney disease, or unspecified chronic kidney disease: Secondary | ICD-10-CM | POA: Diagnosis not present

## 2016-08-24 DIAGNOSIS — R531 Weakness: Secondary | ICD-10-CM | POA: Diagnosis not present

## 2016-08-24 DIAGNOSIS — J984 Other disorders of lung: Secondary | ICD-10-CM | POA: Diagnosis not present

## 2016-08-24 DIAGNOSIS — Z825 Family history of asthma and other chronic lower respiratory diseases: Secondary | ICD-10-CM | POA: Diagnosis not present

## 2016-08-24 DIAGNOSIS — N183 Chronic kidney disease, stage 3 (moderate): Secondary | ICD-10-CM | POA: Diagnosis not present

## 2016-08-24 DIAGNOSIS — I69928 Other speech and language deficits following unspecified cerebrovascular disease: Secondary | ICD-10-CM | POA: Diagnosis not present

## 2016-08-24 DIAGNOSIS — Z885 Allergy status to narcotic agent status: Secondary | ICD-10-CM | POA: Diagnosis not present

## 2016-08-24 DIAGNOSIS — Z87891 Personal history of nicotine dependence: Secondary | ICD-10-CM | POA: Diagnosis not present

## 2016-08-24 DIAGNOSIS — Z780 Asymptomatic menopausal state: Secondary | ICD-10-CM | POA: Diagnosis not present

## 2016-08-24 DIAGNOSIS — K449 Diaphragmatic hernia without obstruction or gangrene: Secondary | ICD-10-CM | POA: Diagnosis not present

## 2016-08-24 DIAGNOSIS — N184 Chronic kidney disease, stage 4 (severe): Secondary | ICD-10-CM | POA: Diagnosis not present

## 2016-08-24 DIAGNOSIS — I63441 Cerebral infarction due to embolism of right cerebellar artery: Secondary | ICD-10-CM | POA: Diagnosis not present

## 2016-08-24 DIAGNOSIS — D631 Anemia in chronic kidney disease: Secondary | ICD-10-CM | POA: Diagnosis not present

## 2016-08-24 DIAGNOSIS — J449 Chronic obstructive pulmonary disease, unspecified: Secondary | ICD-10-CM | POA: Diagnosis not present

## 2016-08-24 DIAGNOSIS — N189 Chronic kidney disease, unspecified: Secondary | ICD-10-CM | POA: Diagnosis not present

## 2016-08-24 DIAGNOSIS — Z8249 Family history of ischemic heart disease and other diseases of the circulatory system: Secondary | ICD-10-CM | POA: Diagnosis not present

## 2016-08-24 DIAGNOSIS — R0602 Shortness of breath: Secondary | ICD-10-CM | POA: Diagnosis not present

## 2016-08-24 DIAGNOSIS — E785 Hyperlipidemia, unspecified: Secondary | ICD-10-CM | POA: Diagnosis not present

## 2016-08-24 DIAGNOSIS — R918 Other nonspecific abnormal finding of lung field: Secondary | ICD-10-CM | POA: Diagnosis not present

## 2016-08-24 DIAGNOSIS — E1122 Type 2 diabetes mellitus with diabetic chronic kidney disease: Secondary | ICD-10-CM | POA: Diagnosis not present

## 2016-08-24 DIAGNOSIS — R2689 Other abnormalities of gait and mobility: Secondary | ICD-10-CM | POA: Diagnosis not present

## 2016-10-02 DIAGNOSIS — J439 Emphysema, unspecified: Secondary | ICD-10-CM | POA: Diagnosis not present

## 2016-10-02 DIAGNOSIS — J984 Other disorders of lung: Secondary | ICD-10-CM | POA: Diagnosis not present

## 2016-10-02 DIAGNOSIS — R918 Other nonspecific abnormal finding of lung field: Secondary | ICD-10-CM | POA: Diagnosis not present

## 2016-10-19 ENCOUNTER — Other Ambulatory Visit: Payer: Self-pay | Admitting: Pharmacist

## 2016-10-19 NOTE — Patient Outreach (Signed)
Outreach call to [facility] regarding medication adherence to atorvastatin. Informed by person answering the phone that I have the wrong number.  Harlow Asa, PharmD, [facility] [phone]

## 2016-10-22 DIAGNOSIS — I69398 Other sequelae of cerebral infarction: Secondary | ICD-10-CM | POA: Diagnosis not present

## 2016-10-22 DIAGNOSIS — A0472 Enterocolitis due to Clostridium difficile, not specified as recurrent: Secondary | ICD-10-CM | POA: Diagnosis not present

## 2016-10-22 DIAGNOSIS — J449 Chronic obstructive pulmonary disease, unspecified: Secondary | ICD-10-CM | POA: Diagnosis not present

## 2016-10-22 DIAGNOSIS — I69922 Dysarthria following unspecified cerebrovascular disease: Secondary | ICD-10-CM | POA: Diagnosis not present

## 2016-10-22 DIAGNOSIS — M6281 Muscle weakness (generalized): Secondary | ICD-10-CM | POA: Diagnosis not present

## 2016-10-22 DIAGNOSIS — D631 Anemia in chronic kidney disease: Secondary | ICD-10-CM | POA: Diagnosis not present

## 2016-10-22 DIAGNOSIS — L03116 Cellulitis of left lower limb: Secondary | ICD-10-CM | POA: Diagnosis not present

## 2016-10-22 DIAGNOSIS — Z881 Allergy status to other antibiotic agents status: Secondary | ICD-10-CM | POA: Diagnosis not present

## 2016-10-22 DIAGNOSIS — I69928 Other speech and language deficits following unspecified cerebrovascular disease: Secondary | ICD-10-CM | POA: Diagnosis not present

## 2016-10-22 DIAGNOSIS — Z7901 Long term (current) use of anticoagulants: Secondary | ICD-10-CM | POA: Diagnosis not present

## 2016-10-22 DIAGNOSIS — R0682 Tachypnea, not elsewhere classified: Secondary | ICD-10-CM | POA: Diagnosis not present

## 2016-10-22 DIAGNOSIS — I10 Essential (primary) hypertension: Secondary | ICD-10-CM | POA: Diagnosis not present

## 2016-10-22 DIAGNOSIS — I509 Heart failure, unspecified: Secondary | ICD-10-CM | POA: Diagnosis not present

## 2016-10-22 DIAGNOSIS — I2510 Atherosclerotic heart disease of native coronary artery without angina pectoris: Secondary | ICD-10-CM | POA: Diagnosis not present

## 2016-10-22 DIAGNOSIS — E1165 Type 2 diabetes mellitus with hyperglycemia: Secondary | ICD-10-CM | POA: Diagnosis not present

## 2016-10-22 DIAGNOSIS — Z87891 Personal history of nicotine dependence: Secondary | ICD-10-CM | POA: Diagnosis not present

## 2016-10-22 DIAGNOSIS — Z885 Allergy status to narcotic agent status: Secondary | ICD-10-CM | POA: Diagnosis not present

## 2016-10-22 DIAGNOSIS — Z780 Asymptomatic menopausal state: Secondary | ICD-10-CM | POA: Diagnosis not present

## 2016-10-22 DIAGNOSIS — N189 Chronic kidney disease, unspecified: Secondary | ICD-10-CM | POA: Diagnosis not present

## 2016-10-22 DIAGNOSIS — Z8673 Personal history of transient ischemic attack (TIA), and cerebral infarction without residual deficits: Secondary | ICD-10-CM | POA: Diagnosis not present

## 2016-10-22 DIAGNOSIS — Z79899 Other long term (current) drug therapy: Secondary | ICD-10-CM | POA: Diagnosis not present

## 2016-10-22 DIAGNOSIS — I4891 Unspecified atrial fibrillation: Secondary | ICD-10-CM | POA: Diagnosis not present

## 2016-10-22 DIAGNOSIS — N184 Chronic kidney disease, stage 4 (severe): Secondary | ICD-10-CM | POA: Diagnosis not present

## 2016-10-22 DIAGNOSIS — E1122 Type 2 diabetes mellitus with diabetic chronic kidney disease: Secondary | ICD-10-CM | POA: Diagnosis not present

## 2016-10-22 DIAGNOSIS — R918 Other nonspecific abnormal finding of lung field: Secondary | ICD-10-CM | POA: Diagnosis not present

## 2016-10-22 DIAGNOSIS — R279 Unspecified lack of coordination: Secondary | ICD-10-CM | POA: Diagnosis not present

## 2016-10-22 DIAGNOSIS — N390 Urinary tract infection, site not specified: Secondary | ICD-10-CM | POA: Diagnosis not present

## 2016-10-22 DIAGNOSIS — R2689 Other abnormalities of gait and mobility: Secondary | ICD-10-CM | POA: Diagnosis not present

## 2016-10-22 DIAGNOSIS — Z7401 Bed confinement status: Secondary | ICD-10-CM | POA: Diagnosis not present

## 2016-10-22 DIAGNOSIS — K449 Diaphragmatic hernia without obstruction or gangrene: Secondary | ICD-10-CM | POA: Diagnosis not present

## 2016-10-22 DIAGNOSIS — I700 Atherosclerosis of aorta: Secondary | ICD-10-CM | POA: Diagnosis not present

## 2016-10-22 DIAGNOSIS — E785 Hyperlipidemia, unspecified: Secondary | ICD-10-CM | POA: Diagnosis not present

## 2016-10-22 DIAGNOSIS — R0602 Shortness of breath: Secondary | ICD-10-CM | POA: Diagnosis not present

## 2016-10-22 DIAGNOSIS — Z8249 Family history of ischemic heart disease and other diseases of the circulatory system: Secondary | ICD-10-CM | POA: Diagnosis not present

## 2016-10-22 DIAGNOSIS — I5021 Acute systolic (congestive) heart failure: Secondary | ICD-10-CM | POA: Diagnosis not present

## 2016-10-22 DIAGNOSIS — J989 Respiratory disorder, unspecified: Secondary | ICD-10-CM | POA: Diagnosis not present

## 2016-10-22 DIAGNOSIS — J441 Chronic obstructive pulmonary disease with (acute) exacerbation: Secondary | ICD-10-CM | POA: Diagnosis not present

## 2016-10-22 DIAGNOSIS — N183 Chronic kidney disease, stage 3 (moderate): Secondary | ICD-10-CM | POA: Diagnosis not present

## 2016-10-22 DIAGNOSIS — B9620 Unspecified Escherichia coli [E. coli] as the cause of diseases classified elsewhere: Secondary | ICD-10-CM | POA: Diagnosis not present

## 2016-10-22 DIAGNOSIS — Z794 Long term (current) use of insulin: Secondary | ICD-10-CM | POA: Diagnosis not present

## 2016-10-22 DIAGNOSIS — Z825 Family history of asthma and other chronic lower respiratory diseases: Secondary | ICD-10-CM | POA: Diagnosis not present

## 2016-10-22 DIAGNOSIS — I130 Hypertensive heart and chronic kidney disease with heart failure and stage 1 through stage 4 chronic kidney disease, or unspecified chronic kidney disease: Secondary | ICD-10-CM | POA: Diagnosis not present

## 2016-10-22 DIAGNOSIS — E1169 Type 2 diabetes mellitus with other specified complication: Secondary | ICD-10-CM | POA: Diagnosis not present

## 2016-10-25 DIAGNOSIS — A0472 Enterocolitis due to Clostridium difficile, not specified as recurrent: Secondary | ICD-10-CM | POA: Diagnosis not present

## 2016-10-25 DIAGNOSIS — I69398 Other sequelae of cerebral infarction: Secondary | ICD-10-CM | POA: Diagnosis not present

## 2016-10-25 DIAGNOSIS — L03116 Cellulitis of left lower limb: Secondary | ICD-10-CM | POA: Diagnosis not present

## 2016-10-25 DIAGNOSIS — N183 Chronic kidney disease, stage 3 (moderate): Secondary | ICD-10-CM | POA: Diagnosis not present

## 2016-10-25 DIAGNOSIS — E1169 Type 2 diabetes mellitus with other specified complication: Secondary | ICD-10-CM | POA: Diagnosis not present

## 2016-10-25 DIAGNOSIS — Z7401 Bed confinement status: Secondary | ICD-10-CM | POA: Diagnosis not present

## 2016-10-25 DIAGNOSIS — R2689 Other abnormalities of gait and mobility: Secondary | ICD-10-CM | POA: Diagnosis not present

## 2016-10-25 DIAGNOSIS — D631 Anemia in chronic kidney disease: Secondary | ICD-10-CM | POA: Diagnosis not present

## 2016-10-25 DIAGNOSIS — M6281 Muscle weakness (generalized): Secondary | ICD-10-CM | POA: Diagnosis not present

## 2016-10-25 DIAGNOSIS — N184 Chronic kidney disease, stage 4 (severe): Secondary | ICD-10-CM | POA: Diagnosis not present

## 2016-10-25 DIAGNOSIS — I69928 Other speech and language deficits following unspecified cerebrovascular disease: Secondary | ICD-10-CM | POA: Diagnosis not present

## 2016-10-25 DIAGNOSIS — I4891 Unspecified atrial fibrillation: Secondary | ICD-10-CM | POA: Diagnosis not present

## 2016-10-25 DIAGNOSIS — J441 Chronic obstructive pulmonary disease with (acute) exacerbation: Secondary | ICD-10-CM | POA: Diagnosis not present

## 2016-10-25 DIAGNOSIS — R279 Unspecified lack of coordination: Secondary | ICD-10-CM | POA: Diagnosis not present

## 2016-10-25 DIAGNOSIS — E785 Hyperlipidemia, unspecified: Secondary | ICD-10-CM | POA: Diagnosis not present

## 2016-10-25 DIAGNOSIS — I5021 Acute systolic (congestive) heart failure: Secondary | ICD-10-CM | POA: Diagnosis not present

## 2016-10-25 DIAGNOSIS — I10 Essential (primary) hypertension: Secondary | ICD-10-CM | POA: Diagnosis not present

## 2016-10-25 DIAGNOSIS — J449 Chronic obstructive pulmonary disease, unspecified: Secondary | ICD-10-CM | POA: Diagnosis not present

## 2016-10-25 DIAGNOSIS — Z780 Asymptomatic menopausal state: Secondary | ICD-10-CM | POA: Diagnosis not present

## 2016-10-25 DIAGNOSIS — I2510 Atherosclerotic heart disease of native coronary artery without angina pectoris: Secondary | ICD-10-CM | POA: Diagnosis not present

## 2016-10-25 DIAGNOSIS — R918 Other nonspecific abnormal finding of lung field: Secondary | ICD-10-CM | POA: Diagnosis not present

## 2016-10-25 DIAGNOSIS — I69922 Dysarthria following unspecified cerebrovascular disease: Secondary | ICD-10-CM | POA: Diagnosis not present

## 2016-10-25 DIAGNOSIS — Z8673 Personal history of transient ischemic attack (TIA), and cerebral infarction without residual deficits: Secondary | ICD-10-CM | POA: Diagnosis not present

## 2016-10-25 DIAGNOSIS — K449 Diaphragmatic hernia without obstruction or gangrene: Secondary | ICD-10-CM | POA: Diagnosis not present

## 2016-10-25 DIAGNOSIS — N390 Urinary tract infection, site not specified: Secondary | ICD-10-CM | POA: Diagnosis not present

## 2016-11-23 DIAGNOSIS — I82412 Acute embolism and thrombosis of left femoral vein: Secondary | ICD-10-CM | POA: Diagnosis not present

## 2016-11-30 DIAGNOSIS — D631 Anemia in chronic kidney disease: Secondary | ICD-10-CM | POA: Diagnosis not present

## 2016-11-30 DIAGNOSIS — R2689 Other abnormalities of gait and mobility: Secondary | ICD-10-CM | POA: Diagnosis not present

## 2016-11-30 DIAGNOSIS — I69928 Other speech and language deficits following unspecified cerebrovascular disease: Secondary | ICD-10-CM | POA: Diagnosis not present

## 2016-11-30 DIAGNOSIS — I69922 Dysarthria following unspecified cerebrovascular disease: Secondary | ICD-10-CM | POA: Diagnosis not present

## 2016-11-30 DIAGNOSIS — M6281 Muscle weakness (generalized): Secondary | ICD-10-CM | POA: Diagnosis not present

## 2016-12-03 DIAGNOSIS — R2689 Other abnormalities of gait and mobility: Secondary | ICD-10-CM | POA: Diagnosis not present

## 2016-12-03 DIAGNOSIS — M6281 Muscle weakness (generalized): Secondary | ICD-10-CM | POA: Diagnosis not present

## 2016-12-03 DIAGNOSIS — I69928 Other speech and language deficits following unspecified cerebrovascular disease: Secondary | ICD-10-CM | POA: Diagnosis not present

## 2016-12-03 DIAGNOSIS — D631 Anemia in chronic kidney disease: Secondary | ICD-10-CM | POA: Diagnosis not present

## 2016-12-03 DIAGNOSIS — I69922 Dysarthria following unspecified cerebrovascular disease: Secondary | ICD-10-CM | POA: Diagnosis not present

## 2016-12-04 DIAGNOSIS — I69922 Dysarthria following unspecified cerebrovascular disease: Secondary | ICD-10-CM | POA: Diagnosis not present

## 2016-12-04 DIAGNOSIS — M6281 Muscle weakness (generalized): Secondary | ICD-10-CM | POA: Diagnosis not present

## 2016-12-04 DIAGNOSIS — E1151 Type 2 diabetes mellitus with diabetic peripheral angiopathy without gangrene: Secondary | ICD-10-CM | POA: Diagnosis not present

## 2016-12-04 DIAGNOSIS — R2689 Other abnormalities of gait and mobility: Secondary | ICD-10-CM | POA: Diagnosis not present

## 2016-12-04 DIAGNOSIS — D631 Anemia in chronic kidney disease: Secondary | ICD-10-CM | POA: Diagnosis not present

## 2016-12-04 DIAGNOSIS — I69928 Other speech and language deficits following unspecified cerebrovascular disease: Secondary | ICD-10-CM | POA: Diagnosis not present

## 2016-12-05 DIAGNOSIS — I69922 Dysarthria following unspecified cerebrovascular disease: Secondary | ICD-10-CM | POA: Diagnosis not present

## 2016-12-05 DIAGNOSIS — I69928 Other speech and language deficits following unspecified cerebrovascular disease: Secondary | ICD-10-CM | POA: Diagnosis not present

## 2016-12-05 DIAGNOSIS — D631 Anemia in chronic kidney disease: Secondary | ICD-10-CM | POA: Diagnosis not present

## 2016-12-05 DIAGNOSIS — M6281 Muscle weakness (generalized): Secondary | ICD-10-CM | POA: Diagnosis not present

## 2016-12-05 DIAGNOSIS — M1712 Unilateral primary osteoarthritis, left knee: Secondary | ICD-10-CM | POA: Diagnosis not present

## 2016-12-05 DIAGNOSIS — R2689 Other abnormalities of gait and mobility: Secondary | ICD-10-CM | POA: Diagnosis not present

## 2016-12-10 DIAGNOSIS — I69928 Other speech and language deficits following unspecified cerebrovascular disease: Secondary | ICD-10-CM | POA: Diagnosis not present

## 2016-12-10 DIAGNOSIS — D631 Anemia in chronic kidney disease: Secondary | ICD-10-CM | POA: Diagnosis not present

## 2016-12-10 DIAGNOSIS — I69922 Dysarthria following unspecified cerebrovascular disease: Secondary | ICD-10-CM | POA: Diagnosis not present

## 2016-12-10 DIAGNOSIS — M6281 Muscle weakness (generalized): Secondary | ICD-10-CM | POA: Diagnosis not present

## 2016-12-10 DIAGNOSIS — R2689 Other abnormalities of gait and mobility: Secondary | ICD-10-CM | POA: Diagnosis not present

## 2016-12-11 DIAGNOSIS — R2689 Other abnormalities of gait and mobility: Secondary | ICD-10-CM | POA: Diagnosis not present

## 2016-12-11 DIAGNOSIS — I69922 Dysarthria following unspecified cerebrovascular disease: Secondary | ICD-10-CM | POA: Diagnosis not present

## 2016-12-11 DIAGNOSIS — I69928 Other speech and language deficits following unspecified cerebrovascular disease: Secondary | ICD-10-CM | POA: Diagnosis not present

## 2016-12-11 DIAGNOSIS — D631 Anemia in chronic kidney disease: Secondary | ICD-10-CM | POA: Diagnosis not present

## 2016-12-11 DIAGNOSIS — M6281 Muscle weakness (generalized): Secondary | ICD-10-CM | POA: Diagnosis not present

## 2016-12-14 DIAGNOSIS — D631 Anemia in chronic kidney disease: Secondary | ICD-10-CM | POA: Diagnosis not present

## 2016-12-14 DIAGNOSIS — R2689 Other abnormalities of gait and mobility: Secondary | ICD-10-CM | POA: Diagnosis not present

## 2016-12-14 DIAGNOSIS — I69928 Other speech and language deficits following unspecified cerebrovascular disease: Secondary | ICD-10-CM | POA: Diagnosis not present

## 2016-12-14 DIAGNOSIS — M6281 Muscle weakness (generalized): Secondary | ICD-10-CM | POA: Diagnosis not present

## 2016-12-14 DIAGNOSIS — I69922 Dysarthria following unspecified cerebrovascular disease: Secondary | ICD-10-CM | POA: Diagnosis not present

## 2016-12-18 DIAGNOSIS — I69922 Dysarthria following unspecified cerebrovascular disease: Secondary | ICD-10-CM | POA: Diagnosis not present

## 2016-12-18 DIAGNOSIS — M6281 Muscle weakness (generalized): Secondary | ICD-10-CM | POA: Diagnosis not present

## 2016-12-18 DIAGNOSIS — R2689 Other abnormalities of gait and mobility: Secondary | ICD-10-CM | POA: Diagnosis not present

## 2016-12-18 DIAGNOSIS — I69928 Other speech and language deficits following unspecified cerebrovascular disease: Secondary | ICD-10-CM | POA: Diagnosis not present

## 2016-12-18 DIAGNOSIS — D631 Anemia in chronic kidney disease: Secondary | ICD-10-CM | POA: Diagnosis not present

## 2016-12-19 DIAGNOSIS — I69922 Dysarthria following unspecified cerebrovascular disease: Secondary | ICD-10-CM | POA: Diagnosis not present

## 2016-12-19 DIAGNOSIS — D631 Anemia in chronic kidney disease: Secondary | ICD-10-CM | POA: Diagnosis not present

## 2016-12-19 DIAGNOSIS — I69928 Other speech and language deficits following unspecified cerebrovascular disease: Secondary | ICD-10-CM | POA: Diagnosis not present

## 2016-12-19 DIAGNOSIS — M6281 Muscle weakness (generalized): Secondary | ICD-10-CM | POA: Diagnosis not present

## 2016-12-19 DIAGNOSIS — R2689 Other abnormalities of gait and mobility: Secondary | ICD-10-CM | POA: Diagnosis not present

## 2016-12-20 DIAGNOSIS — D631 Anemia in chronic kidney disease: Secondary | ICD-10-CM | POA: Diagnosis not present

## 2016-12-20 DIAGNOSIS — I69922 Dysarthria following unspecified cerebrovascular disease: Secondary | ICD-10-CM | POA: Diagnosis not present

## 2016-12-20 DIAGNOSIS — M6281 Muscle weakness (generalized): Secondary | ICD-10-CM | POA: Diagnosis not present

## 2016-12-20 DIAGNOSIS — R2689 Other abnormalities of gait and mobility: Secondary | ICD-10-CM | POA: Diagnosis not present

## 2016-12-20 DIAGNOSIS — I69928 Other speech and language deficits following unspecified cerebrovascular disease: Secondary | ICD-10-CM | POA: Diagnosis not present

## 2016-12-21 DIAGNOSIS — I69922 Dysarthria following unspecified cerebrovascular disease: Secondary | ICD-10-CM | POA: Diagnosis not present

## 2016-12-21 DIAGNOSIS — R2689 Other abnormalities of gait and mobility: Secondary | ICD-10-CM | POA: Diagnosis not present

## 2016-12-21 DIAGNOSIS — D631 Anemia in chronic kidney disease: Secondary | ICD-10-CM | POA: Diagnosis not present

## 2016-12-21 DIAGNOSIS — I69928 Other speech and language deficits following unspecified cerebrovascular disease: Secondary | ICD-10-CM | POA: Diagnosis not present

## 2016-12-21 DIAGNOSIS — M6281 Muscle weakness (generalized): Secondary | ICD-10-CM | POA: Diagnosis not present

## 2016-12-22 DIAGNOSIS — I69922 Dysarthria following unspecified cerebrovascular disease: Secondary | ICD-10-CM | POA: Diagnosis not present

## 2016-12-22 DIAGNOSIS — M6281 Muscle weakness (generalized): Secondary | ICD-10-CM | POA: Diagnosis not present

## 2016-12-22 DIAGNOSIS — R2689 Other abnormalities of gait and mobility: Secondary | ICD-10-CM | POA: Diagnosis not present

## 2016-12-22 DIAGNOSIS — D631 Anemia in chronic kidney disease: Secondary | ICD-10-CM | POA: Diagnosis not present

## 2016-12-22 DIAGNOSIS — I69928 Other speech and language deficits following unspecified cerebrovascular disease: Secondary | ICD-10-CM | POA: Diagnosis not present

## 2016-12-24 DIAGNOSIS — I69928 Other speech and language deficits following unspecified cerebrovascular disease: Secondary | ICD-10-CM | POA: Diagnosis not present

## 2016-12-24 DIAGNOSIS — R918 Other nonspecific abnormal finding of lung field: Secondary | ICD-10-CM | POA: Diagnosis not present

## 2016-12-24 DIAGNOSIS — D631 Anemia in chronic kidney disease: Secondary | ICD-10-CM | POA: Diagnosis not present

## 2016-12-24 DIAGNOSIS — R2689 Other abnormalities of gait and mobility: Secondary | ICD-10-CM | POA: Diagnosis not present

## 2016-12-24 DIAGNOSIS — I69922 Dysarthria following unspecified cerebrovascular disease: Secondary | ICD-10-CM | POA: Diagnosis not present

## 2016-12-24 DIAGNOSIS — E119 Type 2 diabetes mellitus without complications: Secondary | ICD-10-CM | POA: Diagnosis not present

## 2016-12-24 DIAGNOSIS — M6281 Muscle weakness (generalized): Secondary | ICD-10-CM | POA: Diagnosis not present

## 2016-12-24 DIAGNOSIS — J441 Chronic obstructive pulmonary disease with (acute) exacerbation: Secondary | ICD-10-CM | POA: Diagnosis not present

## 2016-12-26 DIAGNOSIS — R2689 Other abnormalities of gait and mobility: Secondary | ICD-10-CM | POA: Diagnosis not present

## 2016-12-26 DIAGNOSIS — D631 Anemia in chronic kidney disease: Secondary | ICD-10-CM | POA: Diagnosis not present

## 2016-12-26 DIAGNOSIS — M6281 Muscle weakness (generalized): Secondary | ICD-10-CM | POA: Diagnosis not present

## 2016-12-26 DIAGNOSIS — I69928 Other speech and language deficits following unspecified cerebrovascular disease: Secondary | ICD-10-CM | POA: Diagnosis not present

## 2016-12-26 DIAGNOSIS — I69922 Dysarthria following unspecified cerebrovascular disease: Secondary | ICD-10-CM | POA: Diagnosis not present

## 2016-12-27 DIAGNOSIS — I69922 Dysarthria following unspecified cerebrovascular disease: Secondary | ICD-10-CM | POA: Diagnosis not present

## 2016-12-27 DIAGNOSIS — I69928 Other speech and language deficits following unspecified cerebrovascular disease: Secondary | ICD-10-CM | POA: Diagnosis not present

## 2016-12-27 DIAGNOSIS — D631 Anemia in chronic kidney disease: Secondary | ICD-10-CM | POA: Diagnosis not present

## 2016-12-27 DIAGNOSIS — S91302A Unspecified open wound, left foot, initial encounter: Secondary | ICD-10-CM | POA: Diagnosis not present

## 2016-12-27 DIAGNOSIS — R2689 Other abnormalities of gait and mobility: Secondary | ICD-10-CM | POA: Diagnosis not present

## 2016-12-27 DIAGNOSIS — M6281 Muscle weakness (generalized): Secondary | ICD-10-CM | POA: Diagnosis not present

## 2016-12-28 DIAGNOSIS — D631 Anemia in chronic kidney disease: Secondary | ICD-10-CM | POA: Diagnosis not present

## 2016-12-28 DIAGNOSIS — I69922 Dysarthria following unspecified cerebrovascular disease: Secondary | ICD-10-CM | POA: Diagnosis not present

## 2016-12-28 DIAGNOSIS — I69928 Other speech and language deficits following unspecified cerebrovascular disease: Secondary | ICD-10-CM | POA: Diagnosis not present

## 2016-12-28 DIAGNOSIS — M6281 Muscle weakness (generalized): Secondary | ICD-10-CM | POA: Diagnosis not present

## 2016-12-28 DIAGNOSIS — R2689 Other abnormalities of gait and mobility: Secondary | ICD-10-CM | POA: Diagnosis not present

## 2016-12-30 DIAGNOSIS — M6281 Muscle weakness (generalized): Secondary | ICD-10-CM | POA: Diagnosis not present

## 2016-12-30 DIAGNOSIS — R2689 Other abnormalities of gait and mobility: Secondary | ICD-10-CM | POA: Diagnosis not present

## 2016-12-30 DIAGNOSIS — I69922 Dysarthria following unspecified cerebrovascular disease: Secondary | ICD-10-CM | POA: Diagnosis not present

## 2016-12-30 DIAGNOSIS — I69928 Other speech and language deficits following unspecified cerebrovascular disease: Secondary | ICD-10-CM | POA: Diagnosis not present

## 2016-12-30 DIAGNOSIS — D631 Anemia in chronic kidney disease: Secondary | ICD-10-CM | POA: Diagnosis not present

## 2016-12-31 DIAGNOSIS — M6281 Muscle weakness (generalized): Secondary | ICD-10-CM | POA: Diagnosis not present

## 2016-12-31 DIAGNOSIS — R2689 Other abnormalities of gait and mobility: Secondary | ICD-10-CM | POA: Diagnosis not present

## 2016-12-31 DIAGNOSIS — I69922 Dysarthria following unspecified cerebrovascular disease: Secondary | ICD-10-CM | POA: Diagnosis not present

## 2016-12-31 DIAGNOSIS — I69928 Other speech and language deficits following unspecified cerebrovascular disease: Secondary | ICD-10-CM | POA: Diagnosis not present

## 2016-12-31 DIAGNOSIS — D631 Anemia in chronic kidney disease: Secondary | ICD-10-CM | POA: Diagnosis not present

## 2017-01-02 DIAGNOSIS — I69922 Dysarthria following unspecified cerebrovascular disease: Secondary | ICD-10-CM | POA: Diagnosis not present

## 2017-01-02 DIAGNOSIS — M6281 Muscle weakness (generalized): Secondary | ICD-10-CM | POA: Diagnosis not present

## 2017-01-02 DIAGNOSIS — I69928 Other speech and language deficits following unspecified cerebrovascular disease: Secondary | ICD-10-CM | POA: Diagnosis not present

## 2017-01-02 DIAGNOSIS — R2689 Other abnormalities of gait and mobility: Secondary | ICD-10-CM | POA: Diagnosis not present

## 2017-01-02 DIAGNOSIS — D631 Anemia in chronic kidney disease: Secondary | ICD-10-CM | POA: Diagnosis not present

## 2017-01-03 DIAGNOSIS — I69928 Other speech and language deficits following unspecified cerebrovascular disease: Secondary | ICD-10-CM | POA: Diagnosis not present

## 2017-01-03 DIAGNOSIS — R2689 Other abnormalities of gait and mobility: Secondary | ICD-10-CM | POA: Diagnosis not present

## 2017-01-03 DIAGNOSIS — D631 Anemia in chronic kidney disease: Secondary | ICD-10-CM | POA: Diagnosis not present

## 2017-01-03 DIAGNOSIS — M6281 Muscle weakness (generalized): Secondary | ICD-10-CM | POA: Diagnosis not present

## 2017-01-03 DIAGNOSIS — I69922 Dysarthria following unspecified cerebrovascular disease: Secondary | ICD-10-CM | POA: Diagnosis not present

## 2017-01-07 DIAGNOSIS — D631 Anemia in chronic kidney disease: Secondary | ICD-10-CM | POA: Diagnosis not present

## 2017-01-07 DIAGNOSIS — I69922 Dysarthria following unspecified cerebrovascular disease: Secondary | ICD-10-CM | POA: Diagnosis not present

## 2017-01-07 DIAGNOSIS — R2689 Other abnormalities of gait and mobility: Secondary | ICD-10-CM | POA: Diagnosis not present

## 2017-01-07 DIAGNOSIS — I69928 Other speech and language deficits following unspecified cerebrovascular disease: Secondary | ICD-10-CM | POA: Diagnosis not present

## 2017-01-07 DIAGNOSIS — M6281 Muscle weakness (generalized): Secondary | ICD-10-CM | POA: Diagnosis not present

## 2017-01-08 DIAGNOSIS — D631 Anemia in chronic kidney disease: Secondary | ICD-10-CM | POA: Diagnosis not present

## 2017-01-08 DIAGNOSIS — I69928 Other speech and language deficits following unspecified cerebrovascular disease: Secondary | ICD-10-CM | POA: Diagnosis not present

## 2017-01-08 DIAGNOSIS — R2689 Other abnormalities of gait and mobility: Secondary | ICD-10-CM | POA: Diagnosis not present

## 2017-01-08 DIAGNOSIS — M6281 Muscle weakness (generalized): Secondary | ICD-10-CM | POA: Diagnosis not present

## 2017-01-08 DIAGNOSIS — I69922 Dysarthria following unspecified cerebrovascular disease: Secondary | ICD-10-CM | POA: Diagnosis not present

## 2017-01-09 DIAGNOSIS — R2689 Other abnormalities of gait and mobility: Secondary | ICD-10-CM | POA: Diagnosis not present

## 2017-01-09 DIAGNOSIS — I69922 Dysarthria following unspecified cerebrovascular disease: Secondary | ICD-10-CM | POA: Diagnosis not present

## 2017-01-09 DIAGNOSIS — I69928 Other speech and language deficits following unspecified cerebrovascular disease: Secondary | ICD-10-CM | POA: Diagnosis not present

## 2017-01-09 DIAGNOSIS — M6281 Muscle weakness (generalized): Secondary | ICD-10-CM | POA: Diagnosis not present

## 2017-01-09 DIAGNOSIS — D631 Anemia in chronic kidney disease: Secondary | ICD-10-CM | POA: Diagnosis not present

## 2017-01-10 DIAGNOSIS — I69928 Other speech and language deficits following unspecified cerebrovascular disease: Secondary | ICD-10-CM | POA: Diagnosis not present

## 2017-01-10 DIAGNOSIS — D631 Anemia in chronic kidney disease: Secondary | ICD-10-CM | POA: Diagnosis not present

## 2017-01-10 DIAGNOSIS — M6281 Muscle weakness (generalized): Secondary | ICD-10-CM | POA: Diagnosis not present

## 2017-01-10 DIAGNOSIS — I69922 Dysarthria following unspecified cerebrovascular disease: Secondary | ICD-10-CM | POA: Diagnosis not present

## 2017-01-10 DIAGNOSIS — R2689 Other abnormalities of gait and mobility: Secondary | ICD-10-CM | POA: Diagnosis not present

## 2017-01-11 DIAGNOSIS — D631 Anemia in chronic kidney disease: Secondary | ICD-10-CM | POA: Diagnosis not present

## 2017-01-11 DIAGNOSIS — I69922 Dysarthria following unspecified cerebrovascular disease: Secondary | ICD-10-CM | POA: Diagnosis not present

## 2017-01-11 DIAGNOSIS — M6281 Muscle weakness (generalized): Secondary | ICD-10-CM | POA: Diagnosis not present

## 2017-01-11 DIAGNOSIS — R2689 Other abnormalities of gait and mobility: Secondary | ICD-10-CM | POA: Diagnosis not present

## 2017-01-11 DIAGNOSIS — I69928 Other speech and language deficits following unspecified cerebrovascular disease: Secondary | ICD-10-CM | POA: Diagnosis not present

## 2017-01-14 DIAGNOSIS — D631 Anemia in chronic kidney disease: Secondary | ICD-10-CM | POA: Diagnosis not present

## 2017-01-14 DIAGNOSIS — M6281 Muscle weakness (generalized): Secondary | ICD-10-CM | POA: Diagnosis not present

## 2017-01-14 DIAGNOSIS — R2689 Other abnormalities of gait and mobility: Secondary | ICD-10-CM | POA: Diagnosis not present

## 2017-01-14 DIAGNOSIS — I69928 Other speech and language deficits following unspecified cerebrovascular disease: Secondary | ICD-10-CM | POA: Diagnosis not present

## 2017-01-14 DIAGNOSIS — I69922 Dysarthria following unspecified cerebrovascular disease: Secondary | ICD-10-CM | POA: Diagnosis not present

## 2017-01-16 DIAGNOSIS — I69922 Dysarthria following unspecified cerebrovascular disease: Secondary | ICD-10-CM | POA: Diagnosis not present

## 2017-01-16 DIAGNOSIS — M6281 Muscle weakness (generalized): Secondary | ICD-10-CM | POA: Diagnosis not present

## 2017-01-16 DIAGNOSIS — D631 Anemia in chronic kidney disease: Secondary | ICD-10-CM | POA: Diagnosis not present

## 2017-01-16 DIAGNOSIS — R2689 Other abnormalities of gait and mobility: Secondary | ICD-10-CM | POA: Diagnosis not present

## 2017-01-16 DIAGNOSIS — I69928 Other speech and language deficits following unspecified cerebrovascular disease: Secondary | ICD-10-CM | POA: Diagnosis not present

## 2017-01-18 DIAGNOSIS — D631 Anemia in chronic kidney disease: Secondary | ICD-10-CM | POA: Diagnosis not present

## 2017-01-18 DIAGNOSIS — I69922 Dysarthria following unspecified cerebrovascular disease: Secondary | ICD-10-CM | POA: Diagnosis not present

## 2017-01-18 DIAGNOSIS — R2689 Other abnormalities of gait and mobility: Secondary | ICD-10-CM | POA: Diagnosis not present

## 2017-01-18 DIAGNOSIS — M6281 Muscle weakness (generalized): Secondary | ICD-10-CM | POA: Diagnosis not present

## 2017-01-18 DIAGNOSIS — I69928 Other speech and language deficits following unspecified cerebrovascular disease: Secondary | ICD-10-CM | POA: Diagnosis not present

## 2017-01-21 DIAGNOSIS — R2689 Other abnormalities of gait and mobility: Secondary | ICD-10-CM | POA: Diagnosis not present

## 2017-01-21 DIAGNOSIS — D631 Anemia in chronic kidney disease: Secondary | ICD-10-CM | POA: Diagnosis not present

## 2017-01-21 DIAGNOSIS — I69922 Dysarthria following unspecified cerebrovascular disease: Secondary | ICD-10-CM | POA: Diagnosis not present

## 2017-01-21 DIAGNOSIS — I69928 Other speech and language deficits following unspecified cerebrovascular disease: Secondary | ICD-10-CM | POA: Diagnosis not present

## 2017-01-21 DIAGNOSIS — M6281 Muscle weakness (generalized): Secondary | ICD-10-CM | POA: Diagnosis not present

## 2017-01-22 DIAGNOSIS — M6281 Muscle weakness (generalized): Secondary | ICD-10-CM | POA: Diagnosis not present

## 2017-01-22 DIAGNOSIS — I69928 Other speech and language deficits following unspecified cerebrovascular disease: Secondary | ICD-10-CM | POA: Diagnosis not present

## 2017-01-22 DIAGNOSIS — R2689 Other abnormalities of gait and mobility: Secondary | ICD-10-CM | POA: Diagnosis not present

## 2017-01-22 DIAGNOSIS — D631 Anemia in chronic kidney disease: Secondary | ICD-10-CM | POA: Diagnosis not present

## 2017-01-22 DIAGNOSIS — I69922 Dysarthria following unspecified cerebrovascular disease: Secondary | ICD-10-CM | POA: Diagnosis not present

## 2017-01-23 DIAGNOSIS — I69928 Other speech and language deficits following unspecified cerebrovascular disease: Secondary | ICD-10-CM | POA: Diagnosis not present

## 2017-01-23 DIAGNOSIS — M6281 Muscle weakness (generalized): Secondary | ICD-10-CM | POA: Diagnosis not present

## 2017-01-23 DIAGNOSIS — I69922 Dysarthria following unspecified cerebrovascular disease: Secondary | ICD-10-CM | POA: Diagnosis not present

## 2017-01-23 DIAGNOSIS — R2689 Other abnormalities of gait and mobility: Secondary | ICD-10-CM | POA: Diagnosis not present

## 2017-01-23 DIAGNOSIS — D631 Anemia in chronic kidney disease: Secondary | ICD-10-CM | POA: Diagnosis not present

## 2017-01-24 DIAGNOSIS — D631 Anemia in chronic kidney disease: Secondary | ICD-10-CM | POA: Diagnosis not present

## 2017-01-24 DIAGNOSIS — R2689 Other abnormalities of gait and mobility: Secondary | ICD-10-CM | POA: Diagnosis not present

## 2017-01-24 DIAGNOSIS — I69922 Dysarthria following unspecified cerebrovascular disease: Secondary | ICD-10-CM | POA: Diagnosis not present

## 2017-01-24 DIAGNOSIS — I69928 Other speech and language deficits following unspecified cerebrovascular disease: Secondary | ICD-10-CM | POA: Diagnosis not present

## 2017-01-24 DIAGNOSIS — M6281 Muscle weakness (generalized): Secondary | ICD-10-CM | POA: Diagnosis not present

## 2017-01-25 DIAGNOSIS — R2689 Other abnormalities of gait and mobility: Secondary | ICD-10-CM | POA: Diagnosis not present

## 2017-01-25 DIAGNOSIS — I69928 Other speech and language deficits following unspecified cerebrovascular disease: Secondary | ICD-10-CM | POA: Diagnosis not present

## 2017-01-25 DIAGNOSIS — I69922 Dysarthria following unspecified cerebrovascular disease: Secondary | ICD-10-CM | POA: Diagnosis not present

## 2017-01-25 DIAGNOSIS — D631 Anemia in chronic kidney disease: Secondary | ICD-10-CM | POA: Diagnosis not present

## 2017-01-25 DIAGNOSIS — M6281 Muscle weakness (generalized): Secondary | ICD-10-CM | POA: Diagnosis not present

## 2017-01-28 DIAGNOSIS — M6281 Muscle weakness (generalized): Secondary | ICD-10-CM | POA: Diagnosis not present

## 2017-01-28 DIAGNOSIS — D631 Anemia in chronic kidney disease: Secondary | ICD-10-CM | POA: Diagnosis not present

## 2017-01-28 DIAGNOSIS — R2689 Other abnormalities of gait and mobility: Secondary | ICD-10-CM | POA: Diagnosis not present

## 2017-01-28 DIAGNOSIS — I69922 Dysarthria following unspecified cerebrovascular disease: Secondary | ICD-10-CM | POA: Diagnosis not present

## 2017-01-28 DIAGNOSIS — I69928 Other speech and language deficits following unspecified cerebrovascular disease: Secondary | ICD-10-CM | POA: Diagnosis not present

## 2017-01-29 DIAGNOSIS — M6281 Muscle weakness (generalized): Secondary | ICD-10-CM | POA: Diagnosis not present

## 2017-01-29 DIAGNOSIS — D631 Anemia in chronic kidney disease: Secondary | ICD-10-CM | POA: Diagnosis not present

## 2017-01-29 DIAGNOSIS — I69922 Dysarthria following unspecified cerebrovascular disease: Secondary | ICD-10-CM | POA: Diagnosis not present

## 2017-01-29 DIAGNOSIS — I69928 Other speech and language deficits following unspecified cerebrovascular disease: Secondary | ICD-10-CM | POA: Diagnosis not present

## 2017-01-29 DIAGNOSIS — I5022 Chronic systolic (congestive) heart failure: Secondary | ICD-10-CM | POA: Diagnosis not present

## 2017-01-29 DIAGNOSIS — N184 Chronic kidney disease, stage 4 (severe): Secondary | ICD-10-CM | POA: Diagnosis not present

## 2017-01-29 DIAGNOSIS — I130 Hypertensive heart and chronic kidney disease with heart failure and stage 1 through stage 4 chronic kidney disease, or unspecified chronic kidney disease: Secondary | ICD-10-CM | POA: Diagnosis not present

## 2017-01-29 DIAGNOSIS — I2510 Atherosclerotic heart disease of native coronary artery without angina pectoris: Secondary | ICD-10-CM | POA: Diagnosis not present

## 2017-01-29 DIAGNOSIS — R2689 Other abnormalities of gait and mobility: Secondary | ICD-10-CM | POA: Diagnosis not present

## 2017-01-30 DIAGNOSIS — D631 Anemia in chronic kidney disease: Secondary | ICD-10-CM | POA: Diagnosis not present

## 2017-01-30 DIAGNOSIS — I69922 Dysarthria following unspecified cerebrovascular disease: Secondary | ICD-10-CM | POA: Diagnosis not present

## 2017-01-30 DIAGNOSIS — J449 Chronic obstructive pulmonary disease, unspecified: Secondary | ICD-10-CM | POA: Diagnosis not present

## 2017-01-30 DIAGNOSIS — M6281 Muscle weakness (generalized): Secondary | ICD-10-CM | POA: Diagnosis not present

## 2017-01-30 DIAGNOSIS — I69928 Other speech and language deficits following unspecified cerebrovascular disease: Secondary | ICD-10-CM | POA: Diagnosis not present

## 2017-01-30 DIAGNOSIS — N184 Chronic kidney disease, stage 4 (severe): Secondary | ICD-10-CM | POA: Diagnosis not present

## 2017-01-30 DIAGNOSIS — J441 Chronic obstructive pulmonary disease with (acute) exacerbation: Secondary | ICD-10-CM | POA: Diagnosis not present

## 2017-01-30 DIAGNOSIS — R2689 Other abnormalities of gait and mobility: Secondary | ICD-10-CM | POA: Diagnosis not present

## 2017-01-30 DIAGNOSIS — E785 Hyperlipidemia, unspecified: Secondary | ICD-10-CM | POA: Diagnosis not present

## 2017-01-30 DIAGNOSIS — E119 Type 2 diabetes mellitus without complications: Secondary | ICD-10-CM | POA: Diagnosis not present

## 2017-01-30 DIAGNOSIS — I10 Essential (primary) hypertension: Secondary | ICD-10-CM | POA: Diagnosis not present

## 2017-02-01 DIAGNOSIS — R2689 Other abnormalities of gait and mobility: Secondary | ICD-10-CM | POA: Diagnosis not present

## 2017-02-01 DIAGNOSIS — I69928 Other speech and language deficits following unspecified cerebrovascular disease: Secondary | ICD-10-CM | POA: Diagnosis not present

## 2017-02-01 DIAGNOSIS — M6281 Muscle weakness (generalized): Secondary | ICD-10-CM | POA: Diagnosis not present

## 2017-02-01 DIAGNOSIS — I69922 Dysarthria following unspecified cerebrovascular disease: Secondary | ICD-10-CM | POA: Diagnosis not present

## 2017-02-01 DIAGNOSIS — D631 Anemia in chronic kidney disease: Secondary | ICD-10-CM | POA: Diagnosis not present

## 2017-02-04 DIAGNOSIS — I69928 Other speech and language deficits following unspecified cerebrovascular disease: Secondary | ICD-10-CM | POA: Diagnosis not present

## 2017-02-04 DIAGNOSIS — D631 Anemia in chronic kidney disease: Secondary | ICD-10-CM | POA: Diagnosis not present

## 2017-02-04 DIAGNOSIS — I69922 Dysarthria following unspecified cerebrovascular disease: Secondary | ICD-10-CM | POA: Diagnosis not present

## 2017-02-04 DIAGNOSIS — R2689 Other abnormalities of gait and mobility: Secondary | ICD-10-CM | POA: Diagnosis not present

## 2017-02-04 DIAGNOSIS — M6281 Muscle weakness (generalized): Secondary | ICD-10-CM | POA: Diagnosis not present

## 2017-02-06 DIAGNOSIS — I69922 Dysarthria following unspecified cerebrovascular disease: Secondary | ICD-10-CM | POA: Diagnosis not present

## 2017-02-06 DIAGNOSIS — R2689 Other abnormalities of gait and mobility: Secondary | ICD-10-CM | POA: Diagnosis not present

## 2017-02-06 DIAGNOSIS — M6281 Muscle weakness (generalized): Secondary | ICD-10-CM | POA: Diagnosis not present

## 2017-02-06 DIAGNOSIS — I10 Essential (primary) hypertension: Secondary | ICD-10-CM | POA: Diagnosis not present

## 2017-02-06 DIAGNOSIS — I69928 Other speech and language deficits following unspecified cerebrovascular disease: Secondary | ICD-10-CM | POA: Diagnosis not present

## 2017-02-06 DIAGNOSIS — D631 Anemia in chronic kidney disease: Secondary | ICD-10-CM | POA: Diagnosis not present

## 2017-02-26 DIAGNOSIS — J449 Chronic obstructive pulmonary disease, unspecified: Secondary | ICD-10-CM | POA: Diagnosis not present

## 2017-02-26 DIAGNOSIS — D631 Anemia in chronic kidney disease: Secondary | ICD-10-CM | POA: Diagnosis not present

## 2017-02-26 DIAGNOSIS — I5021 Acute systolic (congestive) heart failure: Secondary | ICD-10-CM | POA: Diagnosis not present

## 2017-02-26 DIAGNOSIS — R918 Other nonspecific abnormal finding of lung field: Secondary | ICD-10-CM | POA: Diagnosis not present

## 2017-02-26 DIAGNOSIS — N184 Chronic kidney disease, stage 4 (severe): Secondary | ICD-10-CM | POA: Diagnosis not present

## 2017-03-01 DIAGNOSIS — B351 Tinea unguium: Secondary | ICD-10-CM | POA: Diagnosis not present

## 2017-03-01 DIAGNOSIS — L84 Corns and callosities: Secondary | ICD-10-CM | POA: Diagnosis not present

## 2017-03-01 DIAGNOSIS — M2011 Hallux valgus (acquired), right foot: Secondary | ICD-10-CM | POA: Diagnosis not present

## 2017-03-01 DIAGNOSIS — E1169 Type 2 diabetes mellitus with other specified complication: Secondary | ICD-10-CM | POA: Diagnosis not present

## 2017-03-26 DIAGNOSIS — I5021 Acute systolic (congestive) heart failure: Secondary | ICD-10-CM | POA: Diagnosis not present

## 2017-03-26 DIAGNOSIS — J449 Chronic obstructive pulmonary disease, unspecified: Secondary | ICD-10-CM | POA: Diagnosis not present

## 2017-03-26 DIAGNOSIS — I4891 Unspecified atrial fibrillation: Secondary | ICD-10-CM | POA: Diagnosis not present

## 2017-03-26 DIAGNOSIS — D631 Anemia in chronic kidney disease: Secondary | ICD-10-CM | POA: Diagnosis not present

## 2017-04-16 DIAGNOSIS — Z79899 Other long term (current) drug therapy: Secondary | ICD-10-CM | POA: Diagnosis not present

## 2017-04-16 DIAGNOSIS — E119 Type 2 diabetes mellitus without complications: Secondary | ICD-10-CM | POA: Diagnosis not present

## 2017-04-16 DIAGNOSIS — E782 Mixed hyperlipidemia: Secondary | ICD-10-CM | POA: Diagnosis not present

## 2017-04-16 DIAGNOSIS — D518 Other vitamin B12 deficiency anemias: Secondary | ICD-10-CM | POA: Diagnosis not present

## 2017-04-23 DIAGNOSIS — I2510 Atherosclerotic heart disease of native coronary artery without angina pectoris: Secondary | ICD-10-CM | POA: Diagnosis not present

## 2017-04-23 DIAGNOSIS — J449 Chronic obstructive pulmonary disease, unspecified: Secondary | ICD-10-CM | POA: Diagnosis not present

## 2017-04-23 DIAGNOSIS — I5021 Acute systolic (congestive) heart failure: Secondary | ICD-10-CM | POA: Diagnosis not present

## 2017-04-23 DIAGNOSIS — N184 Chronic kidney disease, stage 4 (severe): Secondary | ICD-10-CM | POA: Diagnosis not present

## 2017-05-14 DIAGNOSIS — B351 Tinea unguium: Secondary | ICD-10-CM | POA: Diagnosis not present

## 2017-05-14 DIAGNOSIS — L84 Corns and callosities: Secondary | ICD-10-CM | POA: Diagnosis not present

## 2017-05-14 DIAGNOSIS — N184 Chronic kidney disease, stage 4 (severe): Secondary | ICD-10-CM | POA: Diagnosis not present

## 2017-05-21 DIAGNOSIS — D631 Anemia in chronic kidney disease: Secondary | ICD-10-CM | POA: Diagnosis not present

## 2017-05-21 DIAGNOSIS — N390 Urinary tract infection, site not specified: Secondary | ICD-10-CM | POA: Diagnosis not present

## 2017-05-21 DIAGNOSIS — J449 Chronic obstructive pulmonary disease, unspecified: Secondary | ICD-10-CM | POA: Diagnosis not present

## 2017-05-21 DIAGNOSIS — I5021 Acute systolic (congestive) heart failure: Secondary | ICD-10-CM | POA: Diagnosis not present

## 2017-06-18 DIAGNOSIS — J984 Other disorders of lung: Secondary | ICD-10-CM | POA: Diagnosis not present

## 2017-06-26 DIAGNOSIS — Z881 Allergy status to other antibiotic agents status: Secondary | ICD-10-CM | POA: Diagnosis not present

## 2017-06-26 DIAGNOSIS — I10 Essential (primary) hypertension: Secondary | ICD-10-CM | POA: Diagnosis not present

## 2017-06-26 DIAGNOSIS — I5021 Acute systolic (congestive) heart failure: Secondary | ICD-10-CM | POA: Diagnosis not present

## 2017-06-26 DIAGNOSIS — N189 Chronic kidney disease, unspecified: Secondary | ICD-10-CM | POA: Diagnosis not present

## 2017-06-26 DIAGNOSIS — M6281 Muscle weakness (generalized): Secondary | ICD-10-CM | POA: Diagnosis not present

## 2017-06-26 DIAGNOSIS — E1122 Type 2 diabetes mellitus with diabetic chronic kidney disease: Secondary | ICD-10-CM | POA: Diagnosis not present

## 2017-06-26 DIAGNOSIS — N184 Chronic kidney disease, stage 4 (severe): Secondary | ICD-10-CM | POA: Diagnosis not present

## 2017-06-26 DIAGNOSIS — R2689 Other abnormalities of gait and mobility: Secondary | ICD-10-CM | POA: Diagnosis not present

## 2017-06-26 DIAGNOSIS — E1169 Type 2 diabetes mellitus with other specified complication: Secondary | ICD-10-CM | POA: Diagnosis not present

## 2017-06-26 DIAGNOSIS — Z809 Family history of malignant neoplasm, unspecified: Secondary | ICD-10-CM | POA: Diagnosis not present

## 2017-06-26 DIAGNOSIS — I130 Hypertensive heart and chronic kidney disease with heart failure and stage 1 through stage 4 chronic kidney disease, or unspecified chronic kidney disease: Secondary | ICD-10-CM | POA: Diagnosis not present

## 2017-06-26 DIAGNOSIS — Z955 Presence of coronary angioplasty implant and graft: Secondary | ICD-10-CM | POA: Diagnosis not present

## 2017-06-26 DIAGNOSIS — I5033 Acute on chronic diastolic (congestive) heart failure: Secondary | ICD-10-CM | POA: Diagnosis not present

## 2017-06-26 DIAGNOSIS — Z87891 Personal history of nicotine dependence: Secondary | ICD-10-CM | POA: Diagnosis not present

## 2017-06-26 DIAGNOSIS — Z79891 Long term (current) use of opiate analgesic: Secondary | ICD-10-CM | POA: Diagnosis not present

## 2017-06-26 DIAGNOSIS — M25562 Pain in left knee: Secondary | ICD-10-CM | POA: Diagnosis not present

## 2017-06-26 DIAGNOSIS — N183 Chronic kidney disease, stage 3 (moderate): Secondary | ICD-10-CM | POA: Diagnosis not present

## 2017-06-26 DIAGNOSIS — I69922 Dysarthria following unspecified cerebrovascular disease: Secondary | ICD-10-CM | POA: Diagnosis not present

## 2017-06-26 DIAGNOSIS — M25561 Pain in right knee: Secondary | ICD-10-CM | POA: Diagnosis not present

## 2017-06-26 DIAGNOSIS — I4891 Unspecified atrial fibrillation: Secondary | ICD-10-CM | POA: Diagnosis not present

## 2017-06-26 DIAGNOSIS — R1313 Dysphagia, pharyngeal phase: Secondary | ICD-10-CM | POA: Diagnosis not present

## 2017-06-26 DIAGNOSIS — R0682 Tachypnea, not elsewhere classified: Secondary | ICD-10-CM | POA: Diagnosis not present

## 2017-06-26 DIAGNOSIS — I679 Cerebrovascular disease, unspecified: Secondary | ICD-10-CM | POA: Diagnosis not present

## 2017-06-26 DIAGNOSIS — Z7902 Long term (current) use of antithrombotics/antiplatelets: Secondary | ICD-10-CM | POA: Diagnosis not present

## 2017-06-26 DIAGNOSIS — R918 Other nonspecific abnormal finding of lung field: Secondary | ICD-10-CM | POA: Diagnosis not present

## 2017-06-26 DIAGNOSIS — G8929 Other chronic pain: Secondary | ICD-10-CM | POA: Diagnosis not present

## 2017-06-26 DIAGNOSIS — Z888 Allergy status to other drugs, medicaments and biological substances status: Secondary | ICD-10-CM | POA: Diagnosis not present

## 2017-06-26 DIAGNOSIS — J449 Chronic obstructive pulmonary disease, unspecified: Secondary | ICD-10-CM | POA: Diagnosis not present

## 2017-06-26 DIAGNOSIS — D631 Anemia in chronic kidney disease: Secondary | ICD-10-CM | POA: Diagnosis not present

## 2017-06-26 DIAGNOSIS — Z7401 Bed confinement status: Secondary | ICD-10-CM | POA: Diagnosis not present

## 2017-06-26 DIAGNOSIS — I69398 Other sequelae of cerebral infarction: Secondary | ICD-10-CM | POA: Diagnosis not present

## 2017-06-26 DIAGNOSIS — R1311 Dysphagia, oral phase: Secondary | ICD-10-CM | POA: Diagnosis not present

## 2017-06-26 DIAGNOSIS — I69928 Other speech and language deficits following unspecified cerebrovascular disease: Secondary | ICD-10-CM | POA: Diagnosis not present

## 2017-06-26 DIAGNOSIS — Z794 Long term (current) use of insulin: Secondary | ICD-10-CM | POA: Diagnosis not present

## 2017-06-26 DIAGNOSIS — I2510 Atherosclerotic heart disease of native coronary artery without angina pectoris: Secondary | ICD-10-CM | POA: Diagnosis not present

## 2017-06-26 DIAGNOSIS — R2242 Localized swelling, mass and lump, left lower limb: Secondary | ICD-10-CM | POA: Diagnosis not present

## 2017-06-26 DIAGNOSIS — Z780 Asymptomatic menopausal state: Secondary | ICD-10-CM | POA: Diagnosis not present

## 2017-06-26 DIAGNOSIS — Z8249 Family history of ischemic heart disease and other diseases of the circulatory system: Secondary | ICD-10-CM | POA: Diagnosis not present

## 2017-06-26 DIAGNOSIS — R279 Unspecified lack of coordination: Secondary | ICD-10-CM | POA: Diagnosis not present

## 2017-06-26 DIAGNOSIS — Z66 Do not resuscitate: Secondary | ICD-10-CM | POA: Diagnosis not present

## 2017-06-26 DIAGNOSIS — E119 Type 2 diabetes mellitus without complications: Secondary | ICD-10-CM | POA: Diagnosis not present

## 2017-06-26 DIAGNOSIS — Z951 Presence of aortocoronary bypass graft: Secondary | ICD-10-CM | POA: Diagnosis not present

## 2017-06-26 DIAGNOSIS — M549 Dorsalgia, unspecified: Secondary | ICD-10-CM | POA: Diagnosis not present

## 2017-06-26 DIAGNOSIS — R2681 Unsteadiness on feet: Secondary | ICD-10-CM | POA: Diagnosis not present

## 2017-06-26 DIAGNOSIS — J90 Pleural effusion, not elsewhere classified: Secondary | ICD-10-CM | POA: Diagnosis not present

## 2017-06-26 DIAGNOSIS — I509 Heart failure, unspecified: Secondary | ICD-10-CM | POA: Diagnosis not present

## 2017-06-26 DIAGNOSIS — R0602 Shortness of breath: Secondary | ICD-10-CM | POA: Diagnosis not present

## 2017-06-26 DIAGNOSIS — E785 Hyperlipidemia, unspecified: Secondary | ICD-10-CM | POA: Diagnosis not present

## 2017-06-26 DIAGNOSIS — K449 Diaphragmatic hernia without obstruction or gangrene: Secondary | ICD-10-CM | POA: Diagnosis not present

## 2017-06-26 DIAGNOSIS — Z8673 Personal history of transient ischemic attack (TIA), and cerebral infarction without residual deficits: Secondary | ICD-10-CM | POA: Diagnosis not present

## 2017-07-01 DIAGNOSIS — I69398 Other sequelae of cerebral infarction: Secondary | ICD-10-CM | POA: Diagnosis not present

## 2017-07-01 DIAGNOSIS — R2681 Unsteadiness on feet: Secondary | ICD-10-CM | POA: Diagnosis not present

## 2017-07-01 DIAGNOSIS — I4891 Unspecified atrial fibrillation: Secondary | ICD-10-CM | POA: Diagnosis not present

## 2017-07-01 DIAGNOSIS — I5033 Acute on chronic diastolic (congestive) heart failure: Secondary | ICD-10-CM | POA: Diagnosis not present

## 2017-07-01 DIAGNOSIS — E785 Hyperlipidemia, unspecified: Secondary | ICD-10-CM | POA: Diagnosis not present

## 2017-07-01 DIAGNOSIS — I10 Essential (primary) hypertension: Secondary | ICD-10-CM | POA: Diagnosis not present

## 2017-07-01 DIAGNOSIS — R279 Unspecified lack of coordination: Secondary | ICD-10-CM | POA: Diagnosis not present

## 2017-07-01 DIAGNOSIS — D631 Anemia in chronic kidney disease: Secondary | ICD-10-CM | POA: Diagnosis not present

## 2017-07-01 DIAGNOSIS — Z7401 Bed confinement status: Secondary | ICD-10-CM | POA: Diagnosis not present

## 2017-07-01 DIAGNOSIS — R918 Other nonspecific abnormal finding of lung field: Secondary | ICD-10-CM | POA: Diagnosis not present

## 2017-07-01 DIAGNOSIS — I69928 Other speech and language deficits following unspecified cerebrovascular disease: Secondary | ICD-10-CM | POA: Diagnosis not present

## 2017-07-01 DIAGNOSIS — J449 Chronic obstructive pulmonary disease, unspecified: Secondary | ICD-10-CM | POA: Diagnosis not present

## 2017-07-01 DIAGNOSIS — R1313 Dysphagia, pharyngeal phase: Secondary | ICD-10-CM | POA: Diagnosis not present

## 2017-07-01 DIAGNOSIS — R2689 Other abnormalities of gait and mobility: Secondary | ICD-10-CM | POA: Diagnosis not present

## 2017-07-01 DIAGNOSIS — K449 Diaphragmatic hernia without obstruction or gangrene: Secondary | ICD-10-CM | POA: Diagnosis not present

## 2017-07-01 DIAGNOSIS — E119 Type 2 diabetes mellitus without complications: Secondary | ICD-10-CM | POA: Diagnosis not present

## 2017-07-01 DIAGNOSIS — M6281 Muscle weakness (generalized): Secondary | ICD-10-CM | POA: Diagnosis not present

## 2017-07-01 DIAGNOSIS — N184 Chronic kidney disease, stage 4 (severe): Secondary | ICD-10-CM | POA: Diagnosis not present

## 2017-07-01 DIAGNOSIS — E1169 Type 2 diabetes mellitus with other specified complication: Secondary | ICD-10-CM | POA: Diagnosis not present

## 2017-07-01 DIAGNOSIS — I5021 Acute systolic (congestive) heart failure: Secondary | ICD-10-CM | POA: Diagnosis not present

## 2017-07-01 DIAGNOSIS — I69922 Dysarthria following unspecified cerebrovascular disease: Secondary | ICD-10-CM | POA: Diagnosis not present

## 2017-07-01 DIAGNOSIS — Z79899 Other long term (current) drug therapy: Secondary | ICD-10-CM | POA: Diagnosis not present

## 2017-07-01 DIAGNOSIS — Z780 Asymptomatic menopausal state: Secondary | ICD-10-CM | POA: Diagnosis not present

## 2017-07-01 DIAGNOSIS — K760 Fatty (change of) liver, not elsewhere classified: Secondary | ICD-10-CM | POA: Diagnosis not present

## 2017-07-01 DIAGNOSIS — I2510 Atherosclerotic heart disease of native coronary artery without angina pectoris: Secondary | ICD-10-CM | POA: Diagnosis not present

## 2017-07-01 DIAGNOSIS — R1311 Dysphagia, oral phase: Secondary | ICD-10-CM | POA: Diagnosis not present

## 2017-07-04 DIAGNOSIS — Z79899 Other long term (current) drug therapy: Secondary | ICD-10-CM | POA: Diagnosis not present

## 2017-07-04 DIAGNOSIS — E119 Type 2 diabetes mellitus without complications: Secondary | ICD-10-CM | POA: Diagnosis not present

## 2017-07-08 DIAGNOSIS — D631 Anemia in chronic kidney disease: Secondary | ICD-10-CM | POA: Diagnosis not present

## 2017-07-08 DIAGNOSIS — I69928 Other speech and language deficits following unspecified cerebrovascular disease: Secondary | ICD-10-CM | POA: Diagnosis not present

## 2017-07-08 DIAGNOSIS — I69922 Dysarthria following unspecified cerebrovascular disease: Secondary | ICD-10-CM | POA: Diagnosis not present

## 2017-07-08 DIAGNOSIS — R2689 Other abnormalities of gait and mobility: Secondary | ICD-10-CM | POA: Diagnosis not present

## 2017-07-08 DIAGNOSIS — M6281 Muscle weakness (generalized): Secondary | ICD-10-CM | POA: Diagnosis not present

## 2017-07-16 DIAGNOSIS — R0789 Other chest pain: Secondary | ICD-10-CM | POA: Diagnosis not present

## 2017-07-16 DIAGNOSIS — J90 Pleural effusion, not elsewhere classified: Secondary | ICD-10-CM | POA: Diagnosis not present

## 2017-07-16 DIAGNOSIS — R072 Precordial pain: Secondary | ICD-10-CM | POA: Diagnosis not present

## 2017-07-17 DIAGNOSIS — J449 Chronic obstructive pulmonary disease, unspecified: Secondary | ICD-10-CM | POA: Diagnosis not present

## 2017-07-17 DIAGNOSIS — I10 Essential (primary) hypertension: Secondary | ICD-10-CM | POA: Diagnosis not present

## 2017-07-17 DIAGNOSIS — I5022 Chronic systolic (congestive) heart failure: Secondary | ICD-10-CM | POA: Diagnosis not present

## 2017-07-17 DIAGNOSIS — R1031 Right lower quadrant pain: Secondary | ICD-10-CM | POA: Diagnosis not present

## 2017-07-18 DIAGNOSIS — R1031 Right lower quadrant pain: Secondary | ICD-10-CM | POA: Diagnosis not present

## 2017-07-19 DIAGNOSIS — I69922 Dysarthria following unspecified cerebrovascular disease: Secondary | ICD-10-CM | POA: Diagnosis not present

## 2017-07-19 DIAGNOSIS — I69928 Other speech and language deficits following unspecified cerebrovascular disease: Secondary | ICD-10-CM | POA: Diagnosis not present

## 2017-07-19 DIAGNOSIS — M6281 Muscle weakness (generalized): Secondary | ICD-10-CM | POA: Diagnosis not present

## 2017-07-19 DIAGNOSIS — D631 Anemia in chronic kidney disease: Secondary | ICD-10-CM | POA: Diagnosis not present

## 2017-07-19 DIAGNOSIS — R2689 Other abnormalities of gait and mobility: Secondary | ICD-10-CM | POA: Diagnosis not present

## 2017-07-22 DIAGNOSIS — D518 Other vitamin B12 deficiency anemias: Secondary | ICD-10-CM | POA: Diagnosis not present

## 2017-07-22 DIAGNOSIS — D631 Anemia in chronic kidney disease: Secondary | ICD-10-CM | POA: Diagnosis not present

## 2017-07-22 DIAGNOSIS — I69928 Other speech and language deficits following unspecified cerebrovascular disease: Secondary | ICD-10-CM | POA: Diagnosis not present

## 2017-07-22 DIAGNOSIS — I69922 Dysarthria following unspecified cerebrovascular disease: Secondary | ICD-10-CM | POA: Diagnosis not present

## 2017-07-22 DIAGNOSIS — Z79899 Other long term (current) drug therapy: Secondary | ICD-10-CM | POA: Diagnosis not present

## 2017-07-22 DIAGNOSIS — R2689 Other abnormalities of gait and mobility: Secondary | ICD-10-CM | POA: Diagnosis not present

## 2017-07-22 DIAGNOSIS — E782 Mixed hyperlipidemia: Secondary | ICD-10-CM | POA: Diagnosis not present

## 2017-07-22 DIAGNOSIS — M6281 Muscle weakness (generalized): Secondary | ICD-10-CM | POA: Diagnosis not present

## 2017-07-22 DIAGNOSIS — E119 Type 2 diabetes mellitus without complications: Secondary | ICD-10-CM | POA: Diagnosis not present

## 2017-07-22 DIAGNOSIS — R238 Other skin changes: Secondary | ICD-10-CM | POA: Diagnosis not present

## 2017-07-23 DIAGNOSIS — D631 Anemia in chronic kidney disease: Secondary | ICD-10-CM | POA: Diagnosis not present

## 2017-07-23 DIAGNOSIS — R2689 Other abnormalities of gait and mobility: Secondary | ICD-10-CM | POA: Diagnosis not present

## 2017-07-23 DIAGNOSIS — I69928 Other speech and language deficits following unspecified cerebrovascular disease: Secondary | ICD-10-CM | POA: Diagnosis not present

## 2017-07-23 DIAGNOSIS — I69922 Dysarthria following unspecified cerebrovascular disease: Secondary | ICD-10-CM | POA: Diagnosis not present

## 2017-07-23 DIAGNOSIS — M6281 Muscle weakness (generalized): Secondary | ICD-10-CM | POA: Diagnosis not present

## 2017-07-24 DIAGNOSIS — I5022 Chronic systolic (congestive) heart failure: Secondary | ICD-10-CM | POA: Diagnosis not present

## 2017-07-24 DIAGNOSIS — R609 Edema, unspecified: Secondary | ICD-10-CM | POA: Diagnosis not present

## 2017-07-24 DIAGNOSIS — D631 Anemia in chronic kidney disease: Secondary | ICD-10-CM | POA: Diagnosis not present

## 2017-07-24 DIAGNOSIS — M6281 Muscle weakness (generalized): Secondary | ICD-10-CM | POA: Diagnosis not present

## 2017-07-24 DIAGNOSIS — I69922 Dysarthria following unspecified cerebrovascular disease: Secondary | ICD-10-CM | POA: Diagnosis not present

## 2017-07-24 DIAGNOSIS — R2689 Other abnormalities of gait and mobility: Secondary | ICD-10-CM | POA: Diagnosis not present

## 2017-07-24 DIAGNOSIS — I69928 Other speech and language deficits following unspecified cerebrovascular disease: Secondary | ICD-10-CM | POA: Diagnosis not present

## 2017-07-25 DIAGNOSIS — I69928 Other speech and language deficits following unspecified cerebrovascular disease: Secondary | ICD-10-CM | POA: Diagnosis not present

## 2017-07-25 DIAGNOSIS — I69922 Dysarthria following unspecified cerebrovascular disease: Secondary | ICD-10-CM | POA: Diagnosis not present

## 2017-07-25 DIAGNOSIS — R2689 Other abnormalities of gait and mobility: Secondary | ICD-10-CM | POA: Diagnosis not present

## 2017-07-25 DIAGNOSIS — M791 Myalgia: Secondary | ICD-10-CM | POA: Diagnosis not present

## 2017-07-25 DIAGNOSIS — D631 Anemia in chronic kidney disease: Secondary | ICD-10-CM | POA: Diagnosis not present

## 2017-07-25 DIAGNOSIS — M6281 Muscle weakness (generalized): Secondary | ICD-10-CM | POA: Diagnosis not present

## 2017-07-26 DIAGNOSIS — D631 Anemia in chronic kidney disease: Secondary | ICD-10-CM | POA: Diagnosis not present

## 2017-07-26 DIAGNOSIS — I69922 Dysarthria following unspecified cerebrovascular disease: Secondary | ICD-10-CM | POA: Diagnosis not present

## 2017-07-26 DIAGNOSIS — J9811 Atelectasis: Secondary | ICD-10-CM | POA: Diagnosis not present

## 2017-07-26 DIAGNOSIS — M549 Dorsalgia, unspecified: Secondary | ICD-10-CM | POA: Diagnosis not present

## 2017-07-26 DIAGNOSIS — J90 Pleural effusion, not elsewhere classified: Secondary | ICD-10-CM | POA: Diagnosis not present

## 2017-07-26 DIAGNOSIS — R1011 Right upper quadrant pain: Secondary | ICD-10-CM | POA: Diagnosis not present

## 2017-07-26 DIAGNOSIS — I700 Atherosclerosis of aorta: Secondary | ICD-10-CM | POA: Diagnosis not present

## 2017-07-26 DIAGNOSIS — M6281 Muscle weakness (generalized): Secondary | ICD-10-CM | POA: Diagnosis not present

## 2017-07-26 DIAGNOSIS — I69928 Other speech and language deficits following unspecified cerebrovascular disease: Secondary | ICD-10-CM | POA: Diagnosis not present

## 2017-07-26 DIAGNOSIS — I517 Cardiomegaly: Secondary | ICD-10-CM | POA: Diagnosis not present

## 2017-07-26 DIAGNOSIS — R2689 Other abnormalities of gait and mobility: Secondary | ICD-10-CM | POA: Diagnosis not present

## 2017-07-26 DIAGNOSIS — R102 Pelvic and perineal pain: Secondary | ICD-10-CM | POA: Diagnosis not present

## 2017-07-29 DIAGNOSIS — I69922 Dysarthria following unspecified cerebrovascular disease: Secondary | ICD-10-CM | POA: Diagnosis not present

## 2017-07-29 DIAGNOSIS — M6281 Muscle weakness (generalized): Secondary | ICD-10-CM | POA: Diagnosis not present

## 2017-07-29 DIAGNOSIS — R2689 Other abnormalities of gait and mobility: Secondary | ICD-10-CM | POA: Diagnosis not present

## 2017-07-29 DIAGNOSIS — D631 Anemia in chronic kidney disease: Secondary | ICD-10-CM | POA: Diagnosis not present

## 2017-07-29 DIAGNOSIS — J90 Pleural effusion, not elsewhere classified: Secondary | ICD-10-CM | POA: Diagnosis not present

## 2017-07-29 DIAGNOSIS — I69928 Other speech and language deficits following unspecified cerebrovascular disease: Secondary | ICD-10-CM | POA: Diagnosis not present

## 2017-07-30 DIAGNOSIS — J918 Pleural effusion in other conditions classified elsewhere: Secondary | ICD-10-CM | POA: Diagnosis not present

## 2017-07-30 DIAGNOSIS — I482 Chronic atrial fibrillation: Secondary | ICD-10-CM | POA: Diagnosis not present

## 2017-07-30 DIAGNOSIS — M545 Low back pain: Secondary | ICD-10-CM | POA: Diagnosis not present

## 2017-07-30 DIAGNOSIS — Z8673 Personal history of transient ischemic attack (TIA), and cerebral infarction without residual deficits: Secondary | ICD-10-CM | POA: Diagnosis not present

## 2017-07-30 DIAGNOSIS — Z7901 Long term (current) use of anticoagulants: Secondary | ICD-10-CM | POA: Diagnosis not present

## 2017-07-30 DIAGNOSIS — Z87891 Personal history of nicotine dependence: Secondary | ICD-10-CM | POA: Diagnosis not present

## 2017-07-30 DIAGNOSIS — R061 Stridor: Secondary | ICD-10-CM | POA: Diagnosis not present

## 2017-07-30 DIAGNOSIS — M179 Osteoarthritis of knee, unspecified: Secondary | ICD-10-CM | POA: Diagnosis not present

## 2017-07-30 DIAGNOSIS — Z7401 Bed confinement status: Secondary | ICD-10-CM | POA: Diagnosis not present

## 2017-07-30 DIAGNOSIS — Z66 Do not resuscitate: Secondary | ICD-10-CM | POA: Diagnosis not present

## 2017-07-30 DIAGNOSIS — R279 Unspecified lack of coordination: Secondary | ICD-10-CM | POA: Diagnosis not present

## 2017-07-30 DIAGNOSIS — J988 Other specified respiratory disorders: Secondary | ICD-10-CM | POA: Diagnosis not present

## 2017-07-30 DIAGNOSIS — Z794 Long term (current) use of insulin: Secondary | ICD-10-CM | POA: Diagnosis not present

## 2017-07-30 DIAGNOSIS — Z79899 Other long term (current) drug therapy: Secondary | ICD-10-CM | POA: Diagnosis not present

## 2017-07-30 DIAGNOSIS — E785 Hyperlipidemia, unspecified: Secondary | ICD-10-CM | POA: Diagnosis not present

## 2017-07-30 DIAGNOSIS — J449 Chronic obstructive pulmonary disease, unspecified: Secondary | ICD-10-CM | POA: Diagnosis not present

## 2017-07-30 DIAGNOSIS — N184 Chronic kidney disease, stage 4 (severe): Secondary | ICD-10-CM | POA: Diagnosis not present

## 2017-07-30 DIAGNOSIS — Z79891 Long term (current) use of opiate analgesic: Secondary | ICD-10-CM | POA: Diagnosis not present

## 2017-07-30 DIAGNOSIS — M25562 Pain in left knee: Secondary | ICD-10-CM | POA: Diagnosis not present

## 2017-07-30 DIAGNOSIS — I5033 Acute on chronic diastolic (congestive) heart failure: Secondary | ICD-10-CM | POA: Diagnosis not present

## 2017-07-30 DIAGNOSIS — D649 Anemia, unspecified: Secondary | ICD-10-CM | POA: Diagnosis not present

## 2017-07-30 DIAGNOSIS — R042 Hemoptysis: Secondary | ICD-10-CM | POA: Diagnosis not present

## 2017-07-30 DIAGNOSIS — G894 Chronic pain syndrome: Secondary | ICD-10-CM | POA: Diagnosis not present

## 2017-07-30 DIAGNOSIS — I130 Hypertensive heart and chronic kidney disease with heart failure and stage 1 through stage 4 chronic kidney disease, or unspecified chronic kidney disease: Secondary | ICD-10-CM | POA: Diagnosis not present

## 2017-07-30 DIAGNOSIS — I2510 Atherosclerotic heart disease of native coronary artery without angina pectoris: Secondary | ICD-10-CM | POA: Diagnosis not present

## 2017-07-30 DIAGNOSIS — M25561 Pain in right knee: Secondary | ICD-10-CM | POA: Diagnosis not present

## 2017-07-30 DIAGNOSIS — E1122 Type 2 diabetes mellitus with diabetic chronic kidney disease: Secondary | ICD-10-CM | POA: Diagnosis not present

## 2017-07-30 DIAGNOSIS — Z23 Encounter for immunization: Secondary | ICD-10-CM | POA: Diagnosis not present

## 2017-07-30 DIAGNOSIS — Z886 Allergy status to analgesic agent status: Secondary | ICD-10-CM | POA: Diagnosis not present

## 2017-07-30 DIAGNOSIS — J90 Pleural effusion, not elsewhere classified: Secondary | ICD-10-CM | POA: Diagnosis not present

## 2017-07-30 DIAGNOSIS — N189 Chronic kidney disease, unspecified: Secondary | ICD-10-CM | POA: Diagnosis not present

## 2017-07-30 DIAGNOSIS — R000 Tachycardia, unspecified: Secondary | ICD-10-CM | POA: Diagnosis not present

## 2017-08-03 DIAGNOSIS — Z23 Encounter for immunization: Secondary | ICD-10-CM | POA: Diagnosis not present

## 2017-08-29 DEATH — deceased
